# Patient Record
Sex: Female | Born: 1985 | Race: White | Hispanic: No | Marital: Single | Smoking: Former smoker
Health system: Southern US, Community
[De-identification: ages and names within clinical notes are randomized; demographics above are authoritative.]

## PROBLEM LIST (undated history)

## (undated) ENCOUNTER — Inpatient Hospital Stay (HOSPITAL_COMMUNITY): Payer: Self-pay

## (undated) DIAGNOSIS — Z9889 Other specified postprocedural states: Secondary | ICD-10-CM

## (undated) DIAGNOSIS — Z789 Other specified health status: Secondary | ICD-10-CM

## (undated) DIAGNOSIS — R112 Nausea with vomiting, unspecified: Secondary | ICD-10-CM

## (undated) DIAGNOSIS — F419 Anxiety disorder, unspecified: Secondary | ICD-10-CM

---
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
## 2014-08-29 ENCOUNTER — Emergency Department (HOSPITAL_COMMUNITY)
Admission: EM | Admit: 2014-08-29 | Discharge: 2014-08-29 | Disposition: A | Discharge status: Home / Self Care | Payer: Self-pay

## 2014-08-29 ENCOUNTER — Inpatient Hospital Stay (HOSPITAL_COMMUNITY): Payer: Medicaid Other

## 2014-08-29 ENCOUNTER — Inpatient Hospital Stay (HOSPITAL_COMMUNITY)
Admission: AD | Admit: 2014-08-29 | Discharge: 2014-08-29 | Disposition: A | Discharge status: Home / Self Care | Payer: Medicaid Other | Source: Ambulatory Visit | Attending: Obstetrics and Gynecology | Admitting: Obstetrics and Gynecology

## 2014-08-29 ENCOUNTER — Encounter (HOSPITAL_COMMUNITY): Payer: Self-pay | Admitting: *Deleted

## 2014-08-29 DIAGNOSIS — R52 Pain, unspecified: Secondary | ICD-10-CM

## 2014-08-29 DIAGNOSIS — Z3490 Encounter for supervision of normal pregnancy, unspecified, unspecified trimester: Secondary | ICD-10-CM

## 2014-08-29 DIAGNOSIS — O99331 Smoking (tobacco) complicating pregnancy, first trimester: Secondary | ICD-10-CM | POA: Diagnosis not present

## 2014-08-29 DIAGNOSIS — Z3A01 Less than 8 weeks gestation of pregnancy: Secondary | ICD-10-CM | POA: Diagnosis not present

## 2014-08-29 DIAGNOSIS — O26899 Other specified pregnancy related conditions, unspecified trimester: Secondary | ICD-10-CM

## 2014-08-29 DIAGNOSIS — O210 Mild hyperemesis gravidarum: Secondary | ICD-10-CM | POA: Diagnosis not present

## 2014-08-29 DIAGNOSIS — O9989 Other specified diseases and conditions complicating pregnancy, childbirth and the puerperium: Secondary | ICD-10-CM | POA: Diagnosis not present

## 2014-08-29 DIAGNOSIS — R109 Unspecified abdominal pain: Secondary | ICD-10-CM | POA: Insufficient documentation

## 2014-08-29 HISTORY — DX: Other specified health status: Z78.9

## 2014-08-29 LAB — URINALYSIS, ROUTINE W REFLEX MICROSCOPIC
Bilirubin Urine: NEGATIVE
GLUCOSE, UA: NEGATIVE mg/dL
Hgb urine dipstick: NEGATIVE
Ketones, ur: NEGATIVE mg/dL
Leukocytes, UA: NEGATIVE
Nitrite: NEGATIVE
Protein, ur: NEGATIVE mg/dL
Specific Gravity, Urine: 1.015 (ref 1.005–1.030)
Urobilinogen, UA: 0.2 mg/dL (ref 0.0–1.0)
pH: 7.5 (ref 5.0–8.0)

## 2014-08-29 LAB — CBC
HEMATOCRIT: 38.8 % (ref 36.0–46.0)
Hemoglobin: 13.3 g/dL (ref 12.0–15.0)
MCH: 30.9 pg (ref 26.0–34.0)
MCHC: 34.3 g/dL (ref 30.0–36.0)
MCV: 90 fL (ref 78.0–100.0)
Platelets: 231 10*3/uL (ref 150–400)
RBC: 4.31 MIL/uL (ref 3.87–5.11)
RDW: 12.4 % (ref 11.5–15.5)
WBC: 9.1 10*3/uL (ref 4.0–10.5)

## 2014-08-29 LAB — WET PREP, GENITAL
Trich, Wet Prep: NONE SEEN
Yeast Wet Prep HPF POC: NONE SEEN

## 2014-08-29 LAB — POCT PREGNANCY, URINE: PREG TEST UR: POSITIVE — AB

## 2014-08-29 LAB — HCG, QUANTITATIVE, PREGNANCY: HCG, BETA CHAIN, QUANT, S: 67128 m[IU]/mL — AB (ref <5)

## 2014-08-29 LAB — ABO/RH: ABO/RH(D): A POS

## 2014-08-29 MED ORDER — ACETAMINOPHEN 325 MG PO TABS
650.0000 mg | ORAL_TABLET | Freq: Once | ORAL | Status: AC
  Administered 2014-08-29: 650 mg via ORAL
  Filled 2014-08-29: qty 2

## 2014-08-29 MED ORDER — METOCLOPRAMIDE HCL 10 MG PO TABS: 10.0000 mg | ORAL_TABLET | Freq: Four times a day (QID) | ORAL | Status: DC

## 2014-08-29 NOTE — MAU Note (Signed)
Cramping in lower abd for 2-3wks.  Nausea for couple wks.  Period is late.

## 2014-08-29 NOTE — MAU Provider Note (Signed)
History     CSN: [id number]  Arrival date and time: 08/29/14 0944   First Provider Initiated Contact with Patient 08/29/14 1039      Chief Complaint  Patient presents with  . Nausea  . Abdominal Cramping  . Possible Pregnancy   HPI Comments: Joan Tran 29 y.o. [id number] [redacted]w[redacted]d presents to MAU with pelvic pains that have been ongoing for 2 weeks. The pain is 3/10 and she had taken Motrin only. She also has nausea  Abdominal Cramping Associated symptoms include nausea.  Possible Pregnancy Associated symptoms include abdominal pain and nausea.      Past Medical History  Diagnosis Date  . Medical history non-contributory     Past Surgical History  Procedure Laterality Date  . Cesarean section      History reviewed. No pertinent family history.  History  Substance Use Topics  . Smoking status: Current Some Day Smoker  . Smokeless tobacco: Not on file  . Alcohol Use: No    Allergies: No Known Allergies  No prescriptions prior to admission    Review of Systems  Constitutional: Negative.   HENT: Negative.   Respiratory: Negative.   Cardiovascular: Negative.   Gastrointestinal: Positive for nausea and abdominal pain.  Musculoskeletal: Negative.   Skin: Negative.   Neurological: Negative.   Psychiatric/Behavioral: Negative.    Physical Exam   Blood pressure 109/57, pulse 65, temperature 98.2 F (36.8 C), temperature source Oral, resp. rate 16, height 5' (1.524 m), weight 71.215 kg (157 lb), last menstrual period 07/16/2014.  Physical Exam  Constitutional: She is oriented to person, place, and time. She appears well-developed and well-nourished. No distress.  HENT:  Head: Normocephalic and atraumatic.  GI: Soft. She exhibits no distension. There is no tenderness. There is no rebound and no guarding.  Genitourinary:  Genital:external negative Vaginal:small amount white discharge Cervix:closed/ thick Bimanual: gravid   Musculoskeletal: Normal range  of motion.  Neurological: She is alert and oriented to person, place, and time.  Skin: Skin is warm.  Psychiatric: She has a normal mood and affect. Her behavior is normal. Judgment and thought content normal.   Results for orders placed or performed during the hospital encounter of 08/29/14 (from the past 24 hour(s))  Urinalysis, Routine w reflex microscopic     Status: Abnormal   Collection Time: 08/29/14  9:55 AM  Result Value Ref Range   Color, Urine YELLOW YELLOW   APPearance CLOUDY (A) CLEAR   Specific Gravity, Urine 1.015 1.005 - 1.030   pH 7.5 5.0 - 8.0   Glucose, UA NEGATIVE NEGATIVE mg/dL   Hgb urine dipstick NEGATIVE NEGATIVE   Bilirubin Urine NEGATIVE NEGATIVE   Ketones, ur NEGATIVE NEGATIVE mg/dL   Protein, ur NEGATIVE NEGATIVE mg/dL   Urobilinogen, UA 0.2 0.0 - 1.0 mg/dL   Nitrite NEGATIVE NEGATIVE   Leukocytes, UA NEGATIVE NEGATIVE  Pregnancy, urine POC     Status: Abnormal   Collection Time: 08/29/14  9:56 AM  Result Value Ref Range   Preg Test, Ur POSITIVE (A) NEGATIVE  Wet prep, genital     Status: Abnormal   Collection Time: 08/29/14 10:57 AM  Result Value Ref Range   Yeast Wet Prep HPF POC NONE SEEN NONE SEEN   Trich, Wet Prep NONE SEEN NONE SEEN   Clue Cells Wet Prep HPF POC FEW (A) NONE SEEN   WBC, Wet Prep HPF POC FEW (A) NONE SEEN  CBC     Status: None   Collection Time: 08/29/14 11:35  AM  Result Value Ref Range   WBC 9.1 4.0 - 10.5 K/uL   RBC 4.31 3.87 - 5.11 MIL/uL   Hemoglobin 13.3 12.0 - 15.0 g/dL   HCT 11.938.8 14.736.0 - 82.946.0 %   MCV 90.0 78.0 - 100.0 fL   MCH 30.9 26.0 - 34.0 pg   MCHC 34.3 30.0 - 36.0 g/dL   RDW 56.212.4 13.011.5 - 86.515.5 %   Platelets 231 150 - 400 K/uL  hCG, quantitative, pregnancy     Status: Abnormal   Collection Time: 08/29/14 11:35 AM  Result Value Ref Range   hCG, Beta Chain, Quant, S [id number] (H) <5 mIU/mL  ABO/Rh     Status: None (Preliminary result)   Collection Time: 08/29/14 11:35 AM  Result Value Ref Range   ABO/RH(D) A POS     [place] Ob Comp Less 14 Wks  08/29/2014   CLINICAL DATA:  Pelvic pain and cramping. Positive pregnancy test. Gestational age by LMP of 6 weeks 2 days.  EXAM: OBSTETRIC <14 WK US AND TRANSVAGINAL OB US  TECHNIQUE: Both transabdominal and transvaginal ultrasound examinations were performed for complete evaluation of the gestation as well as the maternal uterus, adnexal regions, and pelvic cul-de-sac. Transvaginal technique was performed to assess early pregnancy.  COMPARISON:  None.  FINDINGS: Intrauterine gestational sac: Visualized/normal in shape.  Yolk sac:  Visualized  Embryo:  Visualized  Cardiac Activity: Visualized  Heart Rate:  122 bpm  CRL:   4  mm   6 w 1 d                  US EDC: 04/23/2015  Maternal uterus/adnexae: Small subchorionic hemorrhage noted. Both ovaries are normal in appearance. No mass or free fluid visualized.  IMPRESSION: Single living IUP measuring 6 weeks 1 day with US EDC of 04/23/2015.  Small subchorionic hemorrhage noted. No other significant abnormality identified.   Electronically Signed   By: Myles RosenthalJohn  Stahl M.D.   On: 08/29/2014 12:45   [place] Ob Transvaginal  08/29/2014   CLINICAL DATA:  Pelvic pain and cramping. Positive pregnancy test. Gestational age by LMP of 6 weeks 2 days.  EXAM: OBSTETRIC <14 WK US AND TRANSVAGINAL OB US  TECHNIQUE: Both transabdominal and transvaginal ultrasound examinations were performed for complete evaluation of the gestation as well as the maternal uterus, adnexal regions, and pelvic cul-de-sac. Transvaginal technique was performed to assess early pregnancy.  COMPARISON:  None.  FINDINGS: Intrauterine gestational sac: Visualized/normal in shape.  Yolk sac:  Visualized  Embryo:  Visualized  Cardiac Activity: Visualized  Heart Rate:  122 bpm  CRL:   4  mm   6 w 1 d                  US EDC: 04/23/2015  Maternal uterus/adnexae: Small subchorionic hemorrhage noted. Both ovaries are normal in appearance. No mass or free fluid visualized.  IMPRESSION: Single  living IUP measuring 6 weeks 1 day with US EDC of 04/23/2015.  Small subchorionic hemorrhage noted. No other significant abnormality identified.   Electronically Signed   By: Myles RosenthalJohn  Stahl M.D.   On: 08/29/2014 12:45    MAU Course  Procedures  MDM  Wet prep, GC, Chlamydia, CBC, UA, U/S, ABORh, Quant, HIV Tylenol   Assessment and Plan   A: Pain in early pregnancy Nausea in pregnancy  P: Above orders Advised to stop Ibuprofen products/ tylenol only Start PNV and prenatal care Will message [facility] Return to MAU as  needed  Carolynn Serve 08/29/2014, 11:05 AM

## 2014-08-29 NOTE — Discharge Instructions (Signed)

## 2014-08-30 ENCOUNTER — Encounter: Payer: Self-pay | Admitting: Obstetrics and Gynecology

## 2014-08-30 LAB — HIV ANTIBODY (ROUTINE TESTING W REFLEX): HIV-1/HIV-2 Ab: NONREACTIVE

## 2014-08-31 LAB — GC/CHLAMYDIA PROBE AMP (~~LOC~~) NOT AT ARMC
Chlamydia: NEGATIVE
NEISSERIA GONORRHEA: NEGATIVE

## 2014-09-12 ENCOUNTER — Encounter: Payer: Self-pay | Admitting: Obstetrics and Gynecology

## 2014-10-19 ENCOUNTER — Encounter: Payer: Medicaid Other | Admitting: Physician Assistant

## 2014-10-19 ENCOUNTER — Encounter: Payer: Self-pay | Admitting: Physician Assistant

## 2014-10-23 ENCOUNTER — Encounter: Payer: Self-pay | Admitting: General Practice

## 2014-11-23 LAB — OB RESULTS CONSOLE HIV ANTIBODY (ROUTINE TESTING): HIV: NONREACTIVE

## 2014-11-23 LAB — OB RESULTS CONSOLE HEPATITIS B SURFACE ANTIGEN: Hepatitis B Surface Ag: NEGATIVE

## 2014-11-23 LAB — OB RESULTS CONSOLE GC/CHLAMYDIA
Chlamydia: NEGATIVE
Gonorrhea: NEGATIVE

## 2014-11-23 LAB — OB RESULTS CONSOLE ABO/RH: RH Type: POSITIVE

## 2014-11-23 LAB — OB RESULTS CONSOLE RUBELLA ANTIBODY, IGM: Rubella: IMMUNE

## 2014-11-23 LAB — OB RESULTS CONSOLE ANTIBODY SCREEN: ANTIBODY SCREEN: NEGATIVE

## 2015-04-11 ENCOUNTER — Encounter (HOSPITAL_COMMUNITY): Payer: Self-pay

## 2015-04-12 ENCOUNTER — Encounter (HOSPITAL_COMMUNITY)
Admission: RE | Admit: 2015-04-12 | Discharge: 2015-04-12 | Disposition: A | Discharge status: Home / Self Care | Payer: Medicaid Other | Source: Ambulatory Visit | Attending: Obstetrics and Gynecology | Admitting: Obstetrics and Gynecology

## 2015-04-12 ENCOUNTER — Encounter (HOSPITAL_COMMUNITY): Payer: Self-pay

## 2015-04-12 DIAGNOSIS — Z01818 Encounter for other preprocedural examination: Secondary | ICD-10-CM | POA: Insufficient documentation

## 2015-04-12 HISTORY — DX: Nausea with vomiting, unspecified: R11.2

## 2015-04-12 HISTORY — DX: Other specified postprocedural states: Z98.890

## 2015-04-12 LAB — CBC
HCT: 33.8 % — ABNORMAL LOW (ref 36.0–46.0)
HEMOGLOBIN: 11.3 g/dL — AB (ref 12.0–15.0)
MCH: 28.9 pg (ref 26.0–34.0)
MCHC: 33.4 g/dL (ref 30.0–36.0)
MCV: 86.4 fL (ref 78.0–100.0)
Platelets: 232 10*3/uL (ref 150–400)
RBC: 3.91 MIL/uL (ref 3.87–5.11)
RDW: 13.5 % (ref 11.5–15.5)
WBC: 9.2 10*3/uL (ref 4.0–10.5)

## 2015-04-12 LAB — TYPE AND SCREEN
ABO/RH(D): A POS
Antibody Screen: NEGATIVE

## 2015-04-12 NOTE — Patient Instructions (Signed)
Your procedure is scheduled on:  April 15, 2015  Enter through the Main Entrance of [facility] at:  8:00 am   Pick up the phone at the desk and dial [phone].  Call this number if you have problems the morning of surgery: [phone].  Remember: Do NOT eat food: after midnight on Sunday  Do NOT drink clear liquids after:  midnight on Sunday   Take these medicines the morning of surgery with a SIP OF WATER:  None   Do NOT wear jewelry (body piercing), metal hair clips/bobby pins, or nail polish. Do NOT wear lotions, powders, or perfumes.  You may wear deoderant. Do NOT shave for 48 hours prior to surgery. Do NOT bring valuables to the hospital. Leave suitcase in car.  After surgery it may be brought to your room.  For patients admitted to the hospital, checkout time is 11:00 AM the day of discharge.

## 2015-04-13 LAB — RPR: RPR: NONREACTIVE

## 2015-04-14 NOTE — H&P (Signed)
Joan Tran is a 29 y.o. female, G2 P1001, EGA [redacted] weeks with EDC 9-5 presenting for repeat c-section.  Previous LTCS for breech, initially considered VBAC but has decided she wants repeat c-section.  Prenatal care uncomplicated.  Maternal Medical History:  Fetal activity: Perceived fetal activity is normal.    Prenatal complications: no prenatal complications   OB History    Gravida Para Term Preterm AB TAB SAB Ectopic Multiple Living   Past Medical History  Diagnosis Date  . Medical history non-contributory   . PONV (postoperative nausea and vomiting)    Past Surgical History  Procedure Laterality Date  . Cesarean section     Family History: family history is not on file. Social History:  reports that she quit smoking about 6 months ago. She has never used smokeless tobacco. She reports that she uses illicit drugs (Heroin). She reports that she does not drink alcohol.   Prenatal Transfer Tool  Maternal Diabetes: No Genetic Screening: Normal Maternal Ultrasounds/Referrals: Normal Fetal Ultrasounds or other Referrals:  None Maternal Substance Abuse:  No Significant Maternal Medications:  None Significant Maternal Lab Results:  Lab values include: Group B Strep negative Other Comments:  None  Review of Systems  Respiratory: Negative.   Cardiovascular: Negative.       Last menstrual period 07/16/2014. Maternal Exam:  Abdomen: Patient reports no abdominal tenderness. Surgical scars: low transverse.   Estimated fetal weight is 7 1/2 lbs.   Fetal presentation: vertex  Introitus: Normal vulva. Normal vagina.    Physical Exam  Constitutional: She appears well-developed and well-nourished.  Neck: Neck supple. No thyromegaly present.  Cardiovascular: Normal rate, regular rhythm and normal heart sounds.   No murmur heard. Respiratory: Effort normal and breath sounds normal. No respiratory distress. She has no wheezes.  GI: Soft.    Prenatal  labs: ABO, Rh: --/--/A POS (08/26 1150) Antibody: NEG (08/26 1150) Rubella: Immune (04/08 0000) RPR: Non Reactive (08/26 1150)  HBsAg: Negative (04/08 0000)  HIV: Non-reactive (04/08 0000)  GBS:   neg  Assessment/Plan: IUP at 39 weeks for repeat c-section.  Procedure and risks have been discussed.   Alicianna Litchford D 04/14/2015, 7:00 PM

## 2015-04-15 ENCOUNTER — Inpatient Hospital Stay (HOSPITAL_COMMUNITY): Payer: Medicaid Other | Admitting: Anesthesiology

## 2015-04-15 ENCOUNTER — Encounter (HOSPITAL_COMMUNITY)
Admission: RE | Disposition: A | Discharge status: Home / Self Care | Payer: Self-pay | Source: Ambulatory Visit | Attending: Obstetrics and Gynecology

## 2015-04-15 ENCOUNTER — Encounter (HOSPITAL_COMMUNITY): Payer: Self-pay

## 2015-04-15 ENCOUNTER — Inpatient Hospital Stay (HOSPITAL_COMMUNITY)
Admission: RE | Admit: 2015-04-15 | Discharge: 2015-04-17 | DRG: 766 | Disposition: A | Discharge status: Home / Self Care | Payer: Medicaid Other | Source: Ambulatory Visit | Attending: Obstetrics and Gynecology | Admitting: Obstetrics and Gynecology

## 2015-04-15 DIAGNOSIS — O3421 Maternal care for scar from previous cesarean delivery: Secondary | ICD-10-CM | POA: Diagnosis present

## 2015-04-15 DIAGNOSIS — O99214 Obesity complicating childbirth: Secondary | ICD-10-CM | POA: Diagnosis present

## 2015-04-15 DIAGNOSIS — Z87891 Personal history of nicotine dependence: Secondary | ICD-10-CM | POA: Diagnosis not present

## 2015-04-15 DIAGNOSIS — Z3A39 39 weeks gestation of pregnancy: Secondary | ICD-10-CM | POA: Diagnosis present

## 2015-04-15 DIAGNOSIS — Z98891 History of uterine scar from previous surgery: Secondary | ICD-10-CM

## 2015-04-15 SURGERY — Surgical Case
Anesthesia: Spinal | Site: Abdomen

## 2015-04-15 MED ORDER — DIBUCAINE 1 % RE OINT: 1.0000 "application " | TOPICAL_OINTMENT | RECTAL | Status: DC | PRN

## 2015-04-15 MED ORDER — 0.9 % SODIUM CHLORIDE (POUR BTL) OPTIME
TOPICAL | Status: DC | PRN
  Administered 2015-04-15: 1000 mL

## 2015-04-15 MED ORDER — ZOLPIDEM TARTRATE 5 MG PO TABS: 5.0000 mg | ORAL_TABLET | Freq: Every evening | ORAL | Status: DC | PRN

## 2015-04-15 MED ORDER — SCOPOLAMINE 1 MG/3DAYS TD PT72: 1.0000 | MEDICATED_PATCH | Freq: Once | TRANSDERMAL | Status: DC

## 2015-04-15 MED ORDER — HYDROMORPHONE HCL 1 MG/ML IJ SOLN: 0.2500 mg | INTRAMUSCULAR | Status: DC | PRN

## 2015-04-15 MED ORDER — ONDANSETRON HCL 4 MG/2ML IJ SOLN
INTRAMUSCULAR | Status: DC | PRN
  Administered 2015-04-15: 4 mg via INTRAVENOUS

## 2015-04-15 MED ORDER — WITCH HAZEL-GLYCERIN EX PADS: 1.0000 "application " | MEDICATED_PAD | CUTANEOUS | Status: DC | PRN

## 2015-04-15 MED ORDER — PHENYLEPHRINE 8 MG IN D5W 100 ML (0.08MG/ML) PREMIX OPTIME
INJECTION | INTRAVENOUS | Status: DC | PRN
  Administered 2015-04-15: 60 ug/min via INTRAVENOUS

## 2015-04-15 MED ORDER — ERYTHROMYCIN 5 MG/GM OP OINT
TOPICAL_OINTMENT | OPHTHALMIC | Status: AC
  Filled 2015-04-15: qty 1

## 2015-04-15 MED ORDER — MORPHINE SULFATE (PF) 0.5 MG/ML IJ SOLN
INTRAMUSCULAR | Status: DC | PRN
  Administered 2015-04-15: .1 mg via INTRATHECAL

## 2015-04-15 MED ORDER — GLYCOPYRROLATE 0.2 MG/ML IJ SOLN
INTRAMUSCULAR | Status: AC
  Filled 2015-04-15: qty 1

## 2015-04-15 MED ORDER — LACTATED RINGERS IV SOLN
40.0000 [IU] | INTRAVENOUS | Status: DC | PRN
  Administered 2015-04-15: 40 [IU] via INTRAVENOUS

## 2015-04-15 MED ORDER — LACTATED RINGERS IV SOLN: INTRAVENOUS | Status: DC

## 2015-04-15 MED ORDER — MORPHINE SULFATE 0.5 MG/ML IJ SOLN
INTRAMUSCULAR | Status: AC
  Filled 2015-04-15: qty 100

## 2015-04-15 MED ORDER — FENTANYL CITRATE (PF) 100 MCG/2ML IJ SOLN
INTRAMUSCULAR | Status: AC
  Filled 2015-04-15: qty 4

## 2015-04-15 MED ORDER — CEFAZOLIN SODIUM-DEXTROSE 2-3 GM-% IV SOLR
2.0000 g | INTRAVENOUS | Status: AC
  Administered 2015-04-15: 2 g via INTRAVENOUS

## 2015-04-15 MED ORDER — OXYTOCIN 40 UNITS IN LACTATED RINGERS INFUSION - SIMPLE MED: 62.5000 mL/h | INTRAVENOUS | Status: AC

## 2015-04-15 MED ORDER — SODIUM CHLORIDE 0.9 % IJ SOLN: 3.0000 mL | INTRAMUSCULAR | Status: DC | PRN

## 2015-04-15 MED ORDER — NALBUPHINE HCL 10 MG/ML IJ SOLN
5.0000 mg | INTRAMUSCULAR | Status: DC | PRN
  Administered 2015-04-15: 5 mg via SUBCUTANEOUS
  Filled 2015-04-15: qty 1

## 2015-04-15 MED ORDER — NALOXONE HCL 1 MG/ML IJ SOLN
1.0000 ug/kg/h | INTRAMUSCULAR | Status: DC | PRN
  Filled 2015-04-15: qty 2

## 2015-04-15 MED ORDER — CEFAZOLIN SODIUM-DEXTROSE 2-3 GM-% IV SOLR
INTRAVENOUS | Status: AC
  Filled 2015-04-15: qty 50

## 2015-04-15 MED ORDER — ACETAMINOPHEN 500 MG PO TABS
1000.0000 mg | ORAL_TABLET | Freq: Four times a day (QID) | ORAL | Status: AC
  Administered 2015-04-15: 1000 mg via ORAL
  Filled 2015-04-15: qty 2

## 2015-04-15 MED ORDER — MEPERIDINE HCL 25 MG/ML IJ SOLN: 6.2500 mg | INTRAMUSCULAR | Status: DC | PRN

## 2015-04-15 MED ORDER — IBUPROFEN 600 MG PO TABS
600.0000 mg | ORAL_TABLET | Freq: Four times a day (QID) | ORAL | Status: DC
  Administered 2015-04-15 – 2015-04-17 (×8): 600 mg via ORAL
  Filled 2015-04-15 (×8): qty 1

## 2015-04-15 MED ORDER — LACTATED RINGERS IV SOLN
INTRAVENOUS | Status: DC
  Administered 2015-04-15 (×2): via INTRAVENOUS

## 2015-04-15 MED ORDER — ACETAMINOPHEN 160 MG/5ML PO SOLN
ORAL | Status: AC
  Administered 2015-04-15: 975 mg via ORAL
  Filled 2015-04-15: qty 40.6

## 2015-04-15 MED ORDER — SCOPOLAMINE 1 MG/3DAYS TD PT72
1.0000 | MEDICATED_PATCH | Freq: Once | TRANSDERMAL | Status: DC
  Administered 2015-04-15: 1.5 mg via TRANSDERMAL

## 2015-04-15 MED ORDER — SIMETHICONE 80 MG PO CHEW
80.0000 mg | CHEWABLE_TABLET | ORAL | Status: DC | PRN
  Administered 2015-04-16 – 2015-04-17 (×3): 80 mg via ORAL
  Filled 2015-04-15 (×4): qty 1

## 2015-04-15 MED ORDER — DIPHENHYDRAMINE HCL 25 MG PO CAPS: 25.0000 mg | ORAL_CAPSULE | Freq: Four times a day (QID) | ORAL | Status: DC | PRN

## 2015-04-15 MED ORDER — OXYCODONE-ACETAMINOPHEN 5-325 MG PO TABS
1.0000 | ORAL_TABLET | ORAL | Status: DC | PRN
  Administered 2015-04-15: 1 via ORAL
  Administered 2015-04-16 (×4): 2 via ORAL
  Filled 2015-04-15 (×2): qty 2
  Filled 2015-04-15: qty 1
  Filled 2015-04-15 (×2): qty 2

## 2015-04-15 MED ORDER — DIPHENHYDRAMINE HCL 50 MG/ML IJ SOLN: 12.5000 mg | INTRAMUSCULAR | Status: DC | PRN

## 2015-04-15 MED ORDER — MEASLES, MUMPS & RUBELLA VAC ~~LOC~~ INJ: 0.5000 mL | INJECTION | Freq: Once | SUBCUTANEOUS | Status: DC

## 2015-04-15 MED ORDER — LANOLIN HYDROUS EX OINT: 1.0000 "application " | TOPICAL_OINTMENT | CUTANEOUS | Status: DC | PRN

## 2015-04-15 MED ORDER — NALBUPHINE HCL 10 MG/ML IJ SOLN: 5.0000 mg | Freq: Once | INTRAMUSCULAR | Status: AC | PRN

## 2015-04-15 MED ORDER — TETANUS-DIPHTH-ACELL PERTUSSIS 5-2.5-18.5 LF-MCG/0.5 IM SUSP: 0.5000 mL | Freq: Once | INTRAMUSCULAR | Status: DC

## 2015-04-15 MED ORDER — ACETAMINOPHEN 325 MG PO TABS: 650.0000 mg | ORAL_TABLET | ORAL | Status: DC | PRN

## 2015-04-15 MED ORDER — NALBUPHINE HCL 10 MG/ML IJ SOLN: 5.0000 mg | INTRAMUSCULAR | Status: DC | PRN

## 2015-04-15 MED ORDER — NALBUPHINE HCL 10 MG/ML IJ SOLN
INTRAMUSCULAR | Status: AC
  Filled 2015-04-15: qty 1

## 2015-04-15 MED ORDER — KETOROLAC TROMETHAMINE 60 MG/2ML IM SOLN
60.0000 mg | Freq: Once | INTRAMUSCULAR | Status: AC
  Administered 2015-04-15: 60 mg via INTRAMUSCULAR

## 2015-04-15 MED ORDER — ONDANSETRON HCL 4 MG/2ML IJ SOLN: 4.0000 mg | Freq: Three times a day (TID) | INTRAMUSCULAR | Status: DC | PRN

## 2015-04-15 MED ORDER — PRENATAL MULTIVITAMIN CH
1.0000 | ORAL_TABLET | Freq: Every day | ORAL | Status: DC
  Administered 2015-04-16 – 2015-04-17 (×2): 1 via ORAL
  Filled 2015-04-15 (×2): qty 1

## 2015-04-15 MED ORDER — MAGNESIUM HYDROXIDE 400 MG/5ML PO SUSP: 30.0000 mL | ORAL | Status: DC | PRN

## 2015-04-15 MED ORDER — MENTHOL 3 MG MT LOZG: 1.0000 | LOZENGE | OROMUCOSAL | Status: DC | PRN

## 2015-04-15 MED ORDER — LACTATED RINGERS IV SOLN
INTRAVENOUS | Status: DC | PRN
  Administered 2015-04-15: 10:00:00 via INTRAVENOUS

## 2015-04-15 MED ORDER — ACETAMINOPHEN 160 MG/5ML PO SOLN
975.0000 mg | Freq: Once | ORAL | Status: AC
  Administered 2015-04-15: 975 mg via ORAL

## 2015-04-15 MED ORDER — SCOPOLAMINE 1 MG/3DAYS TD PT72
MEDICATED_PATCH | TRANSDERMAL | Status: AC
  Administered 2015-04-15: 1.5 mg via TRANSDERMAL
  Filled 2015-04-15: qty 1

## 2015-04-15 MED ORDER — ONDANSETRON HCL 4 MG/2ML IJ SOLN
INTRAMUSCULAR | Status: AC
  Filled 2015-04-15: qty 2

## 2015-04-15 MED ORDER — NALBUPHINE HCL 10 MG/ML IJ SOLN
5.0000 mg | Freq: Once | INTRAMUSCULAR | Status: AC | PRN
  Administered 2015-04-15: 5 mg via INTRAVENOUS

## 2015-04-15 MED ORDER — KETOROLAC TROMETHAMINE 60 MG/2ML IM SOLN
INTRAMUSCULAR | Status: AC
  Administered 2015-04-15: 60 mg via INTRAMUSCULAR
  Filled 2015-04-15: qty 2

## 2015-04-15 MED ORDER — OXYTOCIN 10 UNIT/ML IJ SOLN
INTRAMUSCULAR | Status: AC
  Filled 2015-04-15: qty 4

## 2015-04-15 MED ORDER — FENTANYL CITRATE (PF) 100 MCG/2ML IJ SOLN
INTRAMUSCULAR | Status: DC | PRN
  Administered 2015-04-15: 25 ug via INTRATHECAL

## 2015-04-15 MED ORDER — SENNOSIDES-DOCUSATE SODIUM 8.6-50 MG PO TABS
2.0000 | ORAL_TABLET | ORAL | Status: DC
  Administered 2015-04-16 (×2): 2 via ORAL
  Filled 2015-04-15 (×2): qty 2

## 2015-04-15 MED ORDER — PHENYLEPHRINE 8 MG IN D5W 100 ML (0.08MG/ML) PREMIX OPTIME
INJECTION | INTRAVENOUS | Status: AC
  Filled 2015-04-15: qty 100

## 2015-04-15 MED ORDER — DIPHENHYDRAMINE HCL 25 MG PO CAPS: 25.0000 mg | ORAL_CAPSULE | ORAL | Status: DC | PRN

## 2015-04-15 MED ORDER — NALOXONE HCL 0.4 MG/ML IJ SOLN: 0.4000 mg | INTRAMUSCULAR | Status: DC | PRN

## 2015-04-15 SURGICAL SUPPLY — 28 items
BENZOIN TINCTURE PRP APPL 2/3 (GAUZE/BANDAGES/DRESSINGS) ×3 IMPLANT
CLAMP CORD UMBIL (MISCELLANEOUS) ×3 IMPLANT
CLOSURE WOUND 1/2 X4 (GAUZE/BANDAGES/DRESSINGS) ×1
CLOTH BEACON ORANGE TIMEOUT ST (SAFETY) ×3 IMPLANT
DRAPE SHEET LG 3/4 BI-LAMINATE (DRAPES) ×3 IMPLANT
DRSG OPSITE POSTOP 4X10 (GAUZE/BANDAGES/DRESSINGS) ×3 IMPLANT
DURAPREP 26ML APPLICATOR (WOUND CARE) ×3 IMPLANT
ELECT REM PT RETURN 9FT ADLT (ELECTROSURGICAL) ×3
ELECTRODE REM PT RTRN 9FT ADLT (ELECTROSURGICAL) ×1 IMPLANT
GLOVE ORTHO TXT STRL SZ7.5 (GLOVE) ×3 IMPLANT
GOWN STRL REUS W/TWL LRG LVL3 (GOWN DISPOSABLE) ×6 IMPLANT
NEEDLE HYPO 25X5/8 SAFETYGLIDE (NEEDLE) ×3 IMPLANT
NS IRRIG 1000ML POUR BTL (IV SOLUTION) ×3 IMPLANT
PACK C SECTION WH (CUSTOM PROCEDURE TRAY) ×3 IMPLANT
PAD OB MATERNITY 4.3X12.25 (PERSONAL CARE ITEMS) ×3 IMPLANT
PENCIL SMOKE EVAC W/HOLSTER (ELECTROSURGICAL) ×3 IMPLANT
RTRCTR C-SECT PINK 25CM LRG (MISCELLANEOUS) ×3 IMPLANT
STRIP CLOSURE SKIN 1/2X4 (GAUZE/BANDAGES/DRESSINGS) ×2 IMPLANT
SUT CHROMIC 1 CTX 36 (SUTURE) ×6 IMPLANT
SUT PLAIN 2 0 XLH (SUTURE) ×3 IMPLANT
SUT VIC AB 0 CT1 27 (SUTURE) ×4
SUT VIC AB 0 CT1 27XBRD ANBCTR (SUTURE) ×2 IMPLANT
SUT VIC AB 2-0 CT1 (SUTURE) ×3 IMPLANT
SUT VIC AB 2-0 CT1 27 (SUTURE) ×2
SUT VIC AB 2-0 CT1 TAPERPNT 27 (SUTURE) ×1 IMPLANT
SUT VIC AB 4-0 KS 27 (SUTURE) ×3 IMPLANT
TOWEL OR 17X24 6PK STRL BLUE (TOWEL DISPOSABLE) ×3 IMPLANT
TRAY FOLEY CATH SILVER 14FR (SET/KITS/TRAYS/PACK) ×3 IMPLANT

## 2015-04-15 NOTE — Addendum Note (Signed)
Addendum  created 04/15/15 1629 by Elgie Congo, CRNA   Modules edited: Notes Section   Notes Section:  File: [id number]

## 2015-04-15 NOTE — Transfer of Care (Signed)
Immediate Anesthesia Transfer of Care Note  Patient: Joan Tran  Procedure(s) Performed: Procedure(s) with comments: CESAREAN SECTION (N/A) - Tracie S. RNFA confirmed 03/22/15   Patient Location: PACU  Anesthesia Type:Spinal  Level of Consciousness: awake, alert  and oriented  Airway & Oxygen Therapy: Patient Spontanous Breathing  Post-op Assessment: Report given to RN and Post -op Vital signs reviewed and stable  Post vital signs: Reviewed and stable  Last Vitals:  Filed Vitals:   04/15/15 0824  BP: 115/88  Pulse: 85  Temp: 36.7 C  Resp: 20    Complications: No apparent anesthesia complications

## 2015-04-15 NOTE — Progress Notes (Signed)
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  Per chart review, MOB participated in 2 year treatment, and denied any use since 2013.  CSW screening out referral at this time. No need to collect infant's UDS/MDS.  Contact CSW if needs arise or upon MOB request.  Loleta Books, LCSW [phone]

## 2015-04-15 NOTE — Anesthesia Procedure Notes (Signed)

## 2015-04-15 NOTE — Op Note (Signed)
Preoperative diagnosis: Intrauterine pregnancy at 39 weeks, previous c-section Postoperative diagnosis: Same Procedure: Repeat low transverse cesarean section without extensions Surgeon: Lavina Hamman M.D. Assistant:  Karmen Stabs, RNFA Anesthesia: Spinal  Findings: Patient had normal gravid anatomy and delivered a viable female infant with Apgars of 9 and 9 weight pending Estimated blood loss: 800 cc Specimens: Placenta sent to labor and delivery Complications: None  Procedure in detail: The patient was taken to the operating room and placed in the sitting position. Dr. Cristela Blue instilled spinal anesthesia.  She was then placed in the dorsosupine position with left tilt. Abdomen was then prepped and draped in the usual sterile fashion, and a foley catheter was inserted. The level of her anesthesia was found to be adequate. Abdomen was entered via a standard Pfannenstiel incision through her previous scar. Once the peritoneal cavity was entered the Alexis disposable self-retaining retractor was placed and good visualization was achieved. A 4 cm transverse incision was then made in the lower uterine segment pushing the bladder inferior. Once the uterine cavity was entered the incision was extended digitally. The fetal vertex was grasped and delivered through the incision atraumatically. Mouth and nares were suctioned. The remainder of the infant then delivered atraumatically. Cord was doubly clamped and cut and the infant handed to the awaiting pediatric team. Cord blood was obtained. The placenta delivered spontaneously. Uterus was wiped dry with clean lap pad and all clots and debris were removed. Uterine incision was inspected and found to be free of extensions. Uterine incision was closed in 1 layer with running #1 Chromic. Bleeding from the right angle was controlled with one figure 8 suture of #1 Chromic.  Tubes and ovaries were inspected and found to be normal. Uterine incision was inspected  and found to be hemostatic. Bleeding from serosal edges was controlled with electrocautery. The Alexis retractor was removed. Subfascial space was irrigated and made hemostatic with electrocautery. Peritoneum was closed with 2-0 Vicryl.  Fascia was closed in running fashion starting at both ends and meeting in the middle with 0 Vicryl. Subcutaneous tissue was then irrigated and made hemostatic with electrocautery, then closed with running 2-0 plain gut. Skin was closed with running 4-0 Vicryl subcuticular suture followed by steri-strips and a sterile dressing. Patient tolerated the procedure well and was taken to the recovery in stable condition. Counts were correct x2, she received Ancef 2 g IV at the beginning of the procedure and she had PAS hose on throughout the procedure.

## 2015-04-15 NOTE — Anesthesia Postprocedure Evaluation (Signed)
  Anesthesia Post-op Note  Patient: Joan Tran  Procedure(s) Performed: Procedure(s) with comments: CESAREAN SECTION (N/A) - Tracie S. RNFA confirmed 03/22/15  Patient is awake, responsive, moving her legs, and has signs of resolution of her numbness. Pain and nausea are reasonably well controlled. Vital signs are stable and clinically acceptable. Oxygen saturation is clinically acceptable. There are no apparent anesthetic complications at this time. Patient is ready for discharge.

## 2015-04-15 NOTE — Anesthesia Preprocedure Evaluation (Signed)
Anesthesia Evaluation  Patient identified by MRN, date of birth, ID band Patient awake    Reviewed: Allergy & Precautions, H&P , Patient's Chart, lab work & pertinent test results  Airway Mallampati: II TM Distance: >3 FB Neck ROM: full    Dental no notable dental hx.    Pulmonary former smoker,  breath sounds clear to auscultation  Pulmonary exam normal       Cardiovascular Exercise Tolerance: Good Rhythm:regular Rate:Normal     Neuro/Psych    GI/Hepatic   Endo/Other  Morbid obesity  Renal/GU      Musculoskeletal   Abdominal   Peds  Hematology   Anesthesia Other Findings   Reproductive/Obstetrics                          Anesthesia Physical Anesthesia Plan  ASA: III  Anesthesia Plan: Spinal   Post-op Pain Management:    Induction:   Airway Management Planned:   Additional Equipment:   Intra-op Plan:   Post-operative Plan:   Informed Consent: I have reviewed the patients History and Physical, chart, labs and discussed the procedure including the risks, benefits and alternatives for the proposed anesthesia with the patient or authorized representative who has indicated his/her understanding and acceptance.   Dental Advisory Given  Plan Discussed with: CRNA  Anesthesia Plan Comments: (Lab work confirmed with CRNA in room. Platelets okay. Discussed spinal anesthetic, and patient consents to the procedure:  included risk of possible headache,backache, failed block, allergic reaction, and nerve injury. This patient was asked if she had any questions or concerns before the procedure started. )        Anesthesia Quick Evaluation  

## 2015-04-15 NOTE — Interval H&P Note (Signed)
History and Physical Interval Note:  04/15/2015 9:08 AM  Joan Tran  has presented today for surgery, with the diagnosis of Repeat C/Section  The various methods of treatment have been discussed with the patient and family. After consideration of risks, benefits and other options for treatment, the patient has consented to  Procedure(s) with comments: CESAREAN SECTION (N/A) - Tracie S. RNFA confirmed 03/22/15  as a surgical intervention .  The patient's history has been reviewed, patient examined, no change in status, stable for surgery.  I have reviewed the patient's chart and labs.  Questions were answered to the patient's satisfaction.     Marcella Charlson D

## 2015-04-15 NOTE — Progress Notes (Signed)
Patient ID: Joan Tran, female   DOB: [birth year], 29 y.o.   MRN: [id number] Pt doing well post op. Sitting up in bed. Pain well controlled. Pt requests d/c iv access. Area around site swollen and infiltrated. Had to have 4-5 attempts at different locations before placement effected  VSS  ABD: fundus firm; dressing c/d/i   Plan : will d/c iv access now and foley at 9pm ( 12hrs post op) if ambulating well

## 2015-04-15 NOTE — Anesthesia Postprocedure Evaluation (Signed)
  Anesthesia Post-op Note  Patient: Joan Tran  Procedure(s) Performed: Procedure(s) with comments: CESAREAN SECTION (N/A) - Tracie S. RNFA confirmed 03/22/15   Patient Location: Mother/Baby  Anesthesia Type:Spinal  Level of Consciousness: awake, alert  and oriented  Airway and Oxygen Therapy: Patient Spontanous Breathing  Post-op Pain: none  Post-op Assessment: Post-op Vital signs reviewed and Patient's Cardiovascular Status Stable              Post-op Vital Signs: Reviewed and stable  Last Vitals:  Filed Vitals:   04/15/15 1400  BP: 109/62  Pulse: 65  Temp: 36.7 C  Resp: 20    Complications: No apparent anesthesia complications

## 2015-04-16 ENCOUNTER — Encounter (HOSPITAL_COMMUNITY): Payer: Self-pay | Admitting: *Deleted

## 2015-04-16 LAB — CBC
HEMATOCRIT: 29 % — AB (ref 36.0–46.0)
Hemoglobin: 9.6 g/dL — ABNORMAL LOW (ref 12.0–15.0)
MCH: 29 pg (ref 26.0–34.0)
MCHC: 33.1 g/dL (ref 30.0–36.0)
MCV: 87.6 fL (ref 78.0–100.0)
Platelets: 196 10*3/uL (ref 150–400)
RBC: 3.31 MIL/uL — ABNORMAL LOW (ref 3.87–5.11)
RDW: 14 % (ref 11.5–15.5)
WBC: 12.5 10*3/uL — AB (ref 4.0–10.5)

## 2015-04-16 MED ORDER — HYDROMORPHONE HCL 2 MG PO TABS
2.0000 mg | ORAL_TABLET | ORAL | Status: DC | PRN
  Administered 2015-04-16 – 2015-04-17 (×4): 2 mg via ORAL
  Filled 2015-04-16 (×5): qty 1

## 2015-04-16 NOTE — Clinical Social Work Maternal (Signed)
CLINICAL SOCIAL WORK MATERNAL/CHILD NOTE  Patient Details  Name: Joan Tran MRN: [id number] Date of Birth: 08/29/1985  Date:  November 16, 2014  Clinical Social Worker Initiating Note:  Joan Books, LCSW Date/ Time Initiated:  May 30, 2015/0900     Child's Name:  Joan Tran Age   Legal Guardian:  Mother   Need for Interpreter:  None   Date of Referral:  July 10, 2015     Reason for Referral:  History of substance use, Potential loss of custody of first child  Referral Source:  [place]   Address:  [address] [place], [place] [address]  Phone number:  [phone]   Household Members:  Spouse   Natural Supports (not living in the home):  Extended Family, Immediate Family   Professional Supports: None   Employment: Full-time   Type of Work:     Education:      Architect:  [employer]   Other Resources:  [employer]   Cultural/Religious Considerations Which May Impact Care:  None reported  Strengths:  Ability to meet basic needs , Home prepared for child    Risk Factors/Current Problems:  None   Cognitive State:  Able to Concentrate , Alert , Goal Oriented , Linear Thinking    Mood/Affect:  Happy , Animated   CSW Assessment:  CSW received request for consult due to MOB presenting with a history of substance use and concerns about custody of her first child.  MOB and FOB presented as easily engaged and receptive to the visit. CSW noted that MOB presented in a pleasant mood and displayed a full range in affect.  MOB's speech was rapid and slightly pressured, but was able to engage in a liner and goal orientated conversation.  Prior to meeting with MOB, CSW consulted with RN who reported that MOB may be anxious or nervous as she transitions to postpartum and cares for the infant.   MOB reported numerous feelings of happiness and excitement secondary to the infant's birth. She stated that she has been eager for his birth, and reported that they have a large support  system of family who has helped them prepare for the infant. MOB and FOB discussed that they recently moved to [place] (previously were living in [place]), and have purchased a home. They discussed how they are both employed (and feel supportive), and are looking forward to watching the MOB's first daughter transition to becoming a big sister. MOB discussed normative feelings of sadness secondary to missing her first daughter's first day of school, but reported that her daughter did not appear impacted by her absence.  MOB reflected upon all the changes that have occurred since her daughter was born 6 years ago. Per MOB, she is in a "better place" now since previously she did not have steady housing and was under the age of 7.  MOB shared that she feels more prepared for this infant, and discussed looking forward to having an infant again. MOB reported that her grandmother and mother assisted her with caring for her daughter while she was younger, but shared that she has always maintained legal custody of her.  MOB discussed that she has remained in contact and interacted regularly with her, and reported that they want to ensure that it is a smooth transition for her back into her home.  CSW acknowledged the numerous life transitions that MOB has experienced in recent moves, and explored with MOB how they may impact her transition postpartum.  MOB acknowledged education on perinatal mood and anxiety, and  shared that she has no prior history of PMAD, but reported that she has had family members who have provided support and education to her about symptoms. MOB did discuss anxiety secondary to finances and also acknowledged the presence of automatic negative thoughts about herself during the pregnancy, but denied belief that they were intense or last lasting. MOB denied any barriers to accessing care in the event that she experiences symptoms.    CSW did not address substance abuse (per request for consult)  due to chart review. MOB's chart documents history of heroin use, but documented that she participated in treatment and has had not been in an active state of addiction since 2013. No need to address history at this time.   MOB denied additional questions, concerns, or needs at this time. She agreed to contact CSW if needs arise during the admission.  CSW Plan/Description:   1)Patient/Family Education: Perinatal mood and anxiety disorders 2)No Further Intervention Required/No Barriers to Discharge    Joan Tran 25-Oct-2014, 9:39 AM

## 2015-04-16 NOTE — Progress Notes (Signed)
Subjective: Postpartum Day #1: Cesarean Delivery Patient reports incisional pain, tolerating PO and no problems voiding.    Objective: Vital signs in last 24 hours: Temp:  [97.6 F (36.4 C)-98.6 F (37 C)] 97.8 F (36.6 C) (08/30 1610) Pulse Rate:  [54-85] 57 (08/30 0633) Resp:  [18-27] 20 (08/30 0633) BP: (96-115)/(43-88) 101/43 mmHg (08/30 0633) SpO2:  [94 %-100 %] 94 % (08/30 9604)  Physical Exam:  General: alert Lochia: appropriate Uterine Fundus: firm Incision: dressing C/D/I   Recent Labs  04/16/15 0540  HGB 9.6*  HCT 29.0*    Assessment/Plan: Status post Cesarean section. Doing well postoperatively.  Continue current care, ambulate.  Beverlyann Broxterman D 04/16/2015, 8:13 AM

## 2015-04-16 NOTE — Progress Notes (Signed)
Upon entering pt's room and introducing myself- pt began c/o unrelieved pain even after pain medication given 1 hour earlier. Spoke with pt regarding the need for ambulation and movement to help with pain and gas pain control. Advised pt on how often and how long to walk in hallways. Pt stated "I will tell you right now that if I don't want to move, I will not move". Then let pt know that I would need to do an assessment on her. Pt then started yelling and stated "I don't know why I have to be checked again, if the doctor already checked me and that other nurse said that I wouldn't have to be checked again!" I made pt aware that each nurse on each shift does an assessment at least once on their pt's. Pt continued to yell, stating "I can refuse to be checked if I want to!". Pt then stated "Well I refuse to have my stomach checked". I said to her that that was ok. I proceeded to assess pt's lung sounds, then asked her if I could take a look at her incision dressing. Pt started yelling again, stating "The doctor already looked at my dressing, so I don't know why you have to look at it again!, I just don't want to be touched anymore!". This nurse then told pt that if she did not want to be checked that was ok, I then asked pt if she wanted me to come in the room at all tonight to check on her. Pt asked "well when would you come in?" I told pt of my rounding times and she said "well that's ok". I then told pt "Itza, let me just let you know that we are her to take care of you and make sure you are doing ok". Pt then started yelling again and stated "Well I don't need a fucking lecture and I don't need anyone making me feel bad when I already feel bad!" I then asked pt if she would like another nurse to take care of her and she stated "yes, because I don't need no fucking lectures from anyone!" I let pt know then that's what we would do and then left the room.

## 2015-04-16 NOTE — Progress Notes (Signed)
UR chart review completed.  

## 2015-04-16 NOTE — Progress Notes (Signed)
Went to speak with patient at request of RN.  Pt stated she did not want to be checked by the nursing staff because she was in pain.  I reviewed with the patient reasons the nurses needed to check her such as watching for hemorrhage and infection or issues with her incision and she said she understood but still refuses to be checked.  I told her that if at any point she would allow the staff to check her she should let her nurse know.

## 2015-04-16 NOTE — Progress Notes (Signed)
Patient ID: Joan Tran, female   DOB: 22-Aug-1985, 29 y.o.   MRN: [id number] Pt reports severe pain. Abdomen soft and not especially tender. Will d/c percocet and begin dilaudid

## 2015-04-17 MED ORDER — IBUPROFEN 600 MG PO TABS: 600.0000 mg | ORAL_TABLET | Freq: Four times a day (QID) | ORAL | Status: DC

## 2015-04-17 MED ORDER — HYDROMORPHONE HCL 2 MG PO TABS
2.0000 mg | ORAL_TABLET | ORAL | Status: DC | PRN
  Administered 2015-04-17 (×2): 2 mg via ORAL
  Filled 2015-04-17: qty 1

## 2015-04-17 MED ORDER — HYDROMORPHONE HCL 2 MG PO TABS: 2.0000 mg | ORAL_TABLET | ORAL | Status: DC | PRN

## 2015-04-17 NOTE — Discharge Summary (Signed)
Obstetric Discharge Summary Reason for Admission: cesarean section Prenatal Procedures: none Intrapartum Procedures: cesarean: low cervical, transverse Postpartum Procedures: none Complications-Operative and Postpartum: none HEMOGLOBIN  Date Value Ref Range Status  04/16/2015 9.6* 12.0 - 15.0 g/dL Final   HCT  Date Value Ref Range Status  04/16/2015 29.0* 36.0 - 46.0 % Final    Physical Exam:  General: alert Lochia: appropriate Uterine Fundus: firm Incision: healing well  Discharge Diagnoses: Term Pregnancy-delivered  Discharge Information: Date: 04/17/2015 Activity: pelvic rest and no strenuous activity Diet: routine Medications: Ibuprofen and Dilaudid Condition: stable Instructions: refer to practice specific booklet Discharge to: home Follow-up Information    Follow up with Millette Halberstam D, MD. Schedule an appointment as soon as possible for a visit in 2 weeks.   Specialty:  Obstetrics and Gynecology   Contact information:   [address], [address] [place] [address] [phone]       Newborn Data: Live born female  Birth Weight: 8 lb 0.4 oz (3640 g) APGAR: 8, 9  Home with mother.  Lorri Fukuhara D 04/17/2015, 1:13 PM

## 2015-04-17 NOTE — Discharge Instructions (Signed)
As per discharge pamphlet °

## 2015-04-17 NOTE — Progress Notes (Signed)
Refusing to allow me to perform my assessment.  Stated Dr. Jackelyn Knife examed her this am already and he said "everything was good."

## 2015-04-17 NOTE — Progress Notes (Signed)
POD #2 Pain is better this am after changing to dilaudid and some relief of gas pain with simethicone and flatus, no other problems Afeb, VSS Abd- soft, fundus firm, incision intact Continue routine care, will make sure she is stable this am, may d/c this pm if stable at her request

## 2016-07-11 IMAGING — US US OB COMP LESS 14 WK
1 series · 14 of 28 positions shown · non-contrast
Comparison: None.

CLINICAL DATA: Pelvic pain and cramping. Positive pregnancy test.
Gestational age by LMP of 6 weeks 2 days.

EXAM:
OBSTETRIC <14 WK US AND TRANSVAGINAL OB US
TECHNIQUE: Both transabdominal and transvaginal ultrasound examinations were
performed for complete evaluation of the gestation as well as the
maternal uterus, adnexal regions, and pelvic cul-de-sac.
Transvaginal technique was performed to assess early pregnancy.

[Series 1: us ob comp less 14 wks · 14 of 50 slices shown]
[im 2/50]
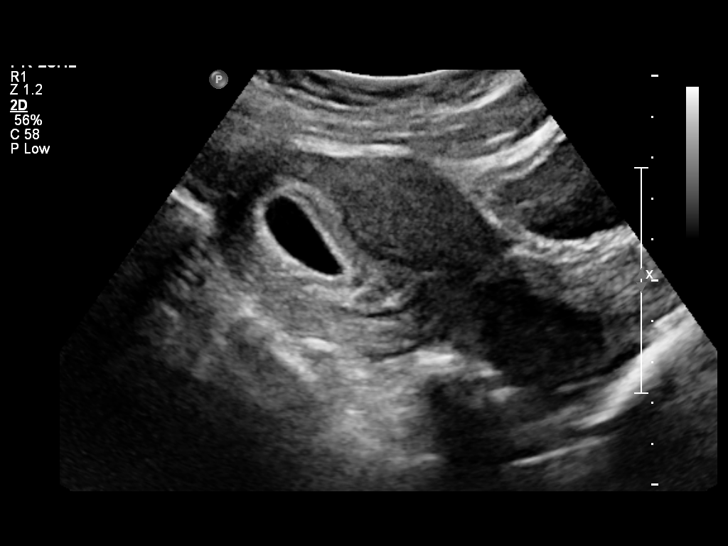
[im 6/50]
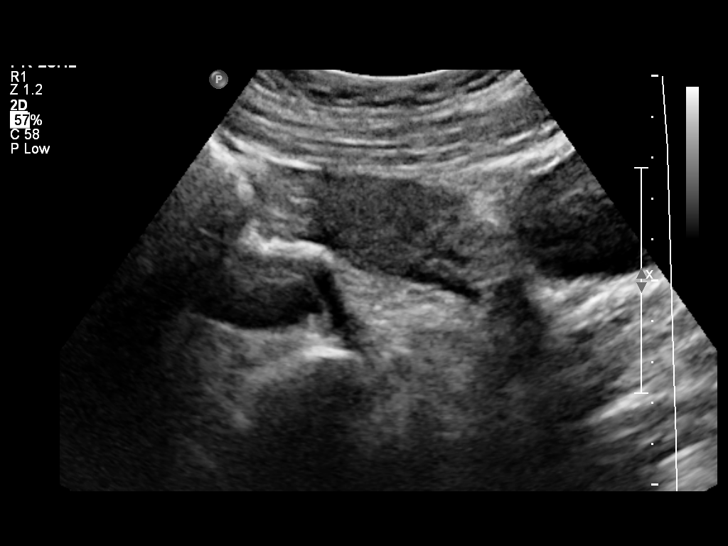
[im 10/50]
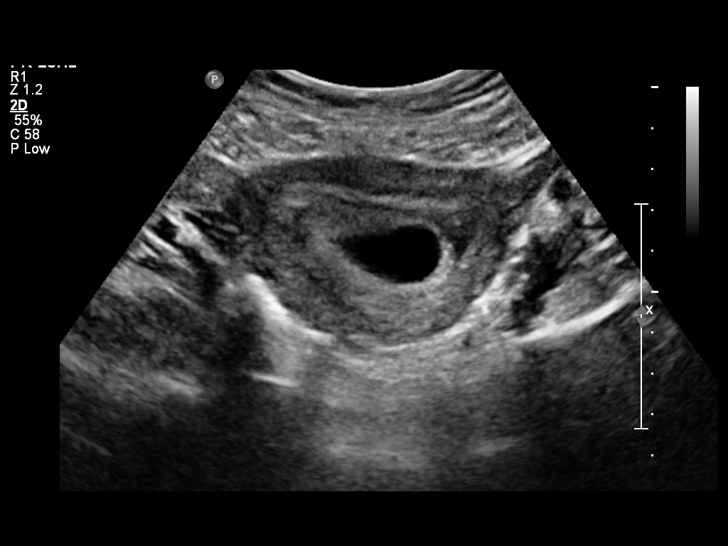
[im 13/50]
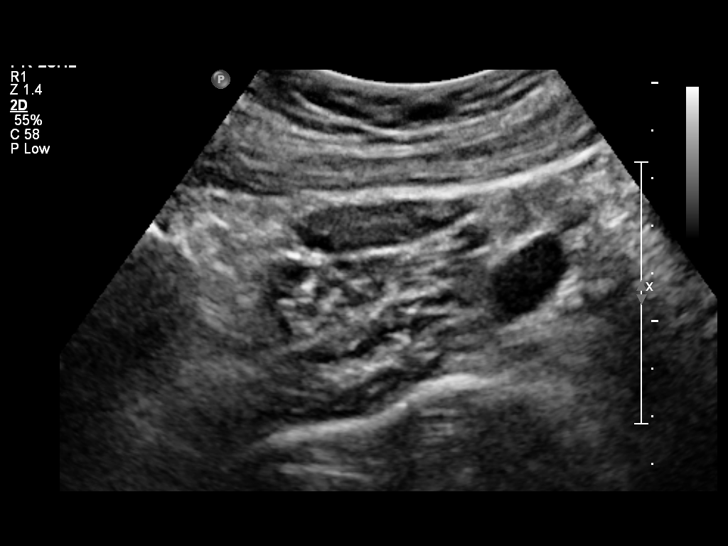
[im 17/50]
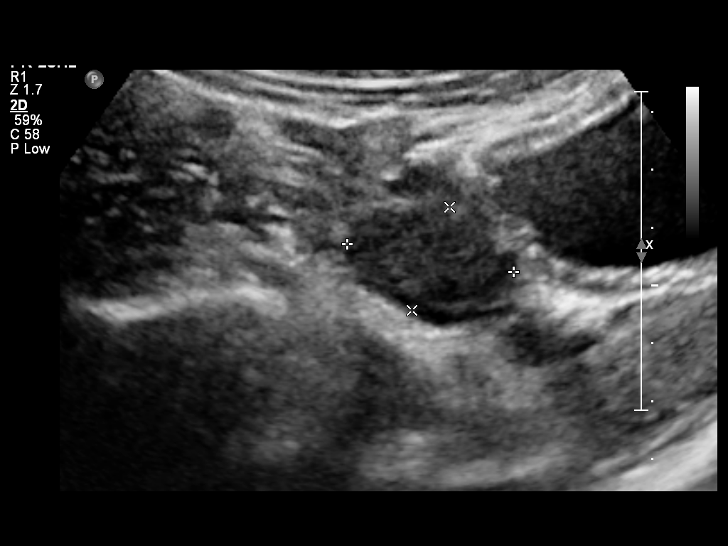
[im 20/50]
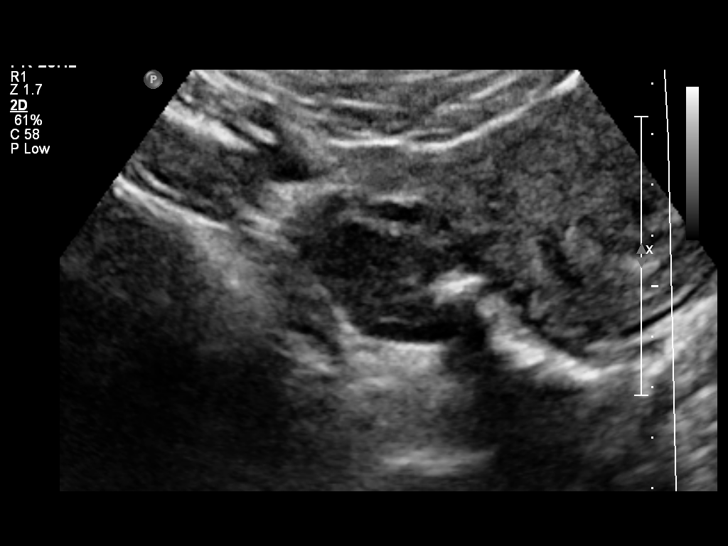
[im 24/50]
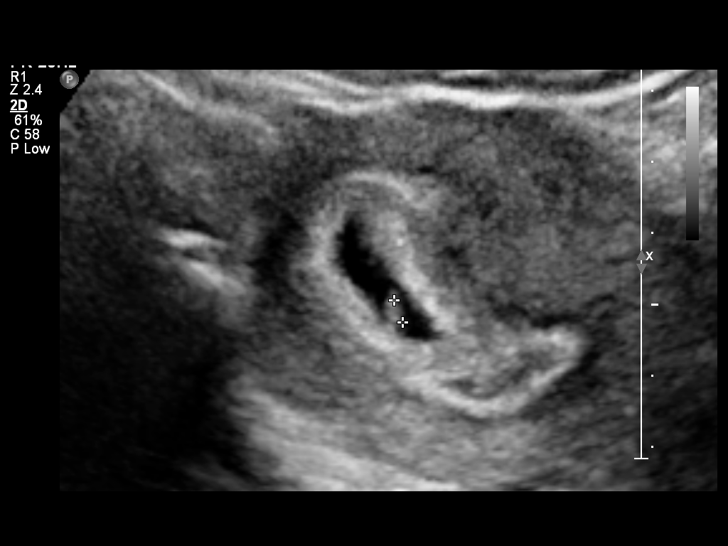
[im 28/50]
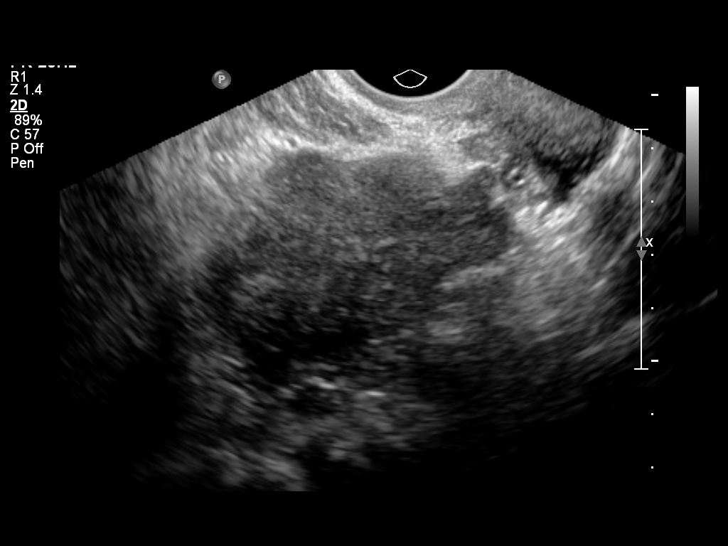
[im 31/50]
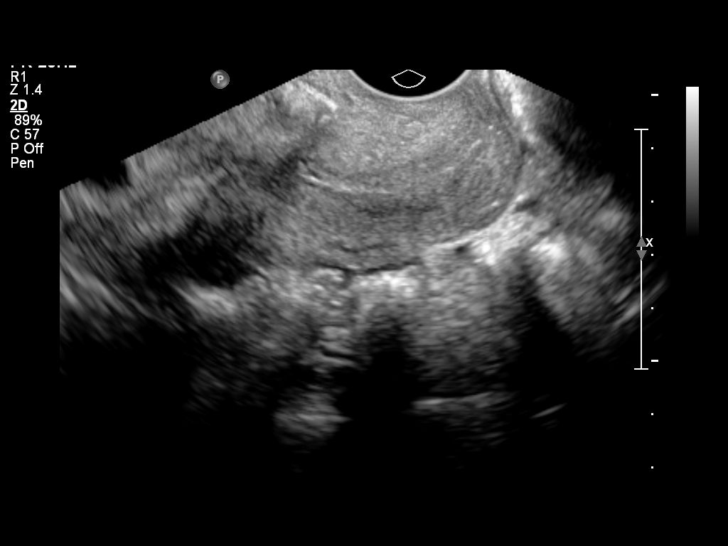
[im 35/50]
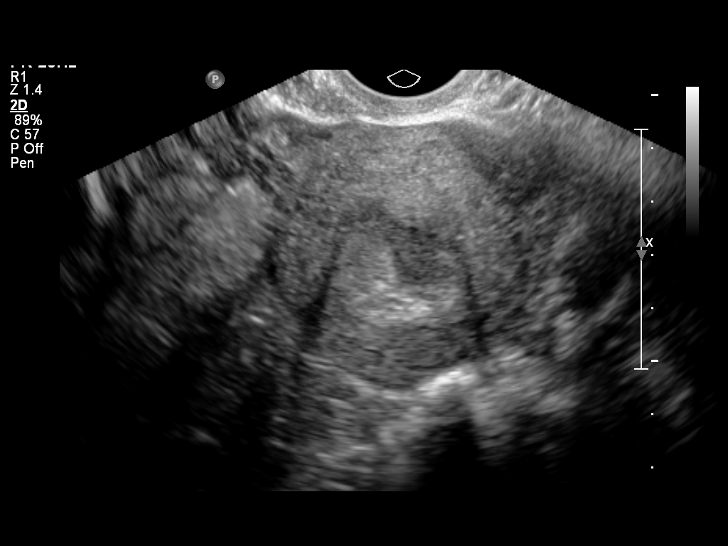
[im 39/50]
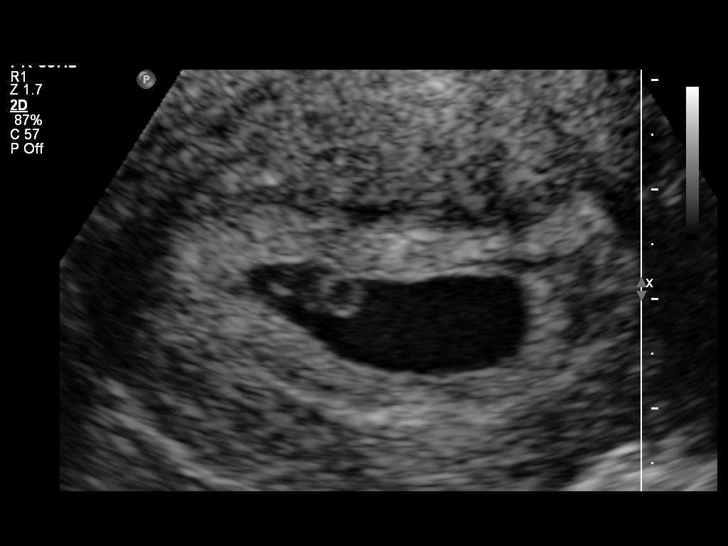
[im 42/50]
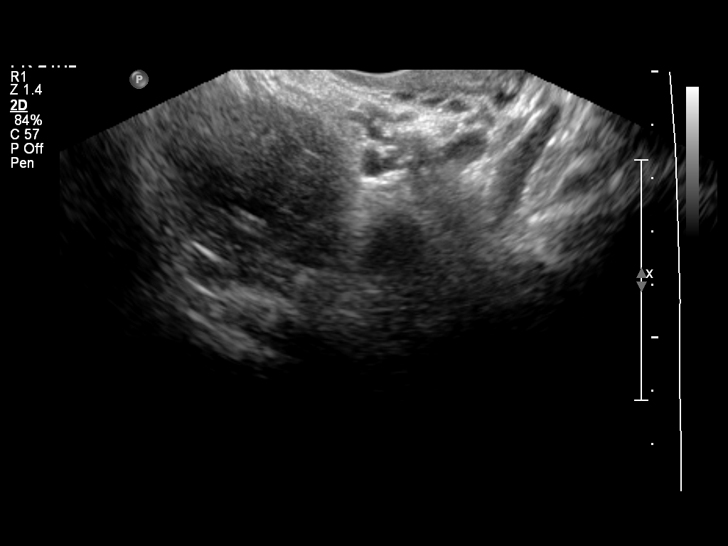
[im 46/50]
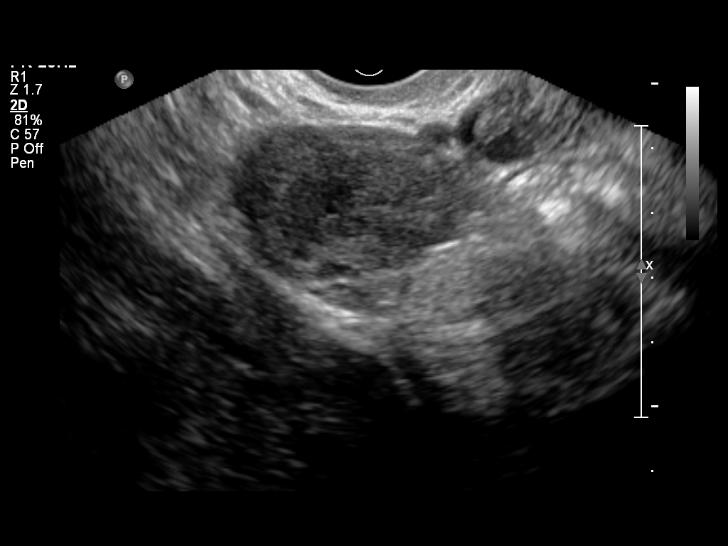
[im 50/50]
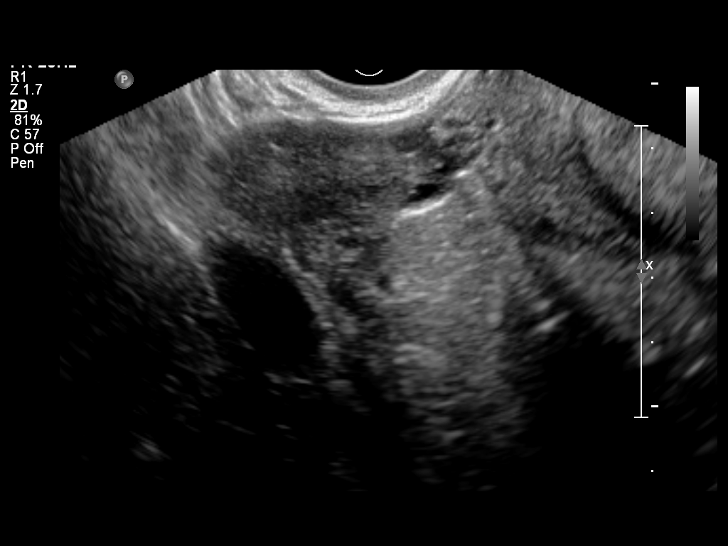

[14 of 28 positions shown; findings below may reference images not displayed]

FINDINGS: Intrauterine gestational sac: Visualized/normal in shape.

Yolk sac:  Visualized

Embryo:  Visualized

Cardiac Activity: Visualized

Heart Rate:  122 bpm

CRL:   4  mm   6 w 1 d                  US EDC: 04/23/2015

Maternal uterus/adnexae: Small subchorionic hemorrhage noted. Both
ovaries are normal in appearance. No mass or free fluid visualized.
IMPRESSION: Single living IUP measuring 6 weeks 1 day with US EDC of 04/23/2015.

Small subchorionic hemorrhage noted. No other significant
abnormality identified.

## 2019-07-11 ENCOUNTER — Inpatient Hospital Stay (HOSPITAL_BASED_OUTPATIENT_CLINIC_OR_DEPARTMENT_OTHER)
Admission: EM | Admit: 2019-07-11 | Discharge: 2019-07-13 | DRG: 787 | Disposition: A | Discharge status: Home / Self Care | Payer: Medicaid Other | Attending: Family Medicine | Admitting: Family Medicine

## 2019-07-11 ENCOUNTER — Encounter (HOSPITAL_BASED_OUTPATIENT_CLINIC_OR_DEPARTMENT_OTHER): Payer: Self-pay

## 2019-07-11 ENCOUNTER — Other Ambulatory Visit: Payer: Self-pay

## 2019-07-11 ENCOUNTER — Encounter (HOSPITAL_COMMUNITY)
Admission: EM | Disposition: A | Discharge status: Home / Self Care | Payer: Self-pay | Source: Home / Self Care | Attending: Family Medicine

## 2019-07-11 ENCOUNTER — Inpatient Hospital Stay (HOSPITAL_COMMUNITY): Payer: Medicaid Other | Admitting: Anesthesiology

## 2019-07-11 ENCOUNTER — Inpatient Hospital Stay (HOSPITAL_COMMUNITY): Payer: Medicaid Other

## 2019-07-11 DIAGNOSIS — O4693 Antepartum hemorrhage, unspecified, third trimester: Secondary | ICD-10-CM

## 2019-07-11 DIAGNOSIS — O26899 Other specified pregnancy related conditions, unspecified trimester: Secondary | ICD-10-CM

## 2019-07-11 DIAGNOSIS — O34219 Maternal care for unspecified type scar from previous cesarean delivery: Secondary | ICD-10-CM

## 2019-07-11 DIAGNOSIS — Z3A32 32 weeks gestation of pregnancy: Secondary | ICD-10-CM

## 2019-07-11 DIAGNOSIS — Z20828 Contact with and (suspected) exposure to other viral communicable diseases: Secondary | ICD-10-CM | POA: Diagnosis present

## 2019-07-11 DIAGNOSIS — O0933 Supervision of pregnancy with insufficient antenatal care, third trimester: Secondary | ICD-10-CM

## 2019-07-11 DIAGNOSIS — O4700 False labor before 37 completed weeks of gestation, unspecified trimester: Secondary | ICD-10-CM

## 2019-07-11 DIAGNOSIS — O99324 Drug use complicating childbirth: Secondary | ICD-10-CM | POA: Diagnosis present

## 2019-07-11 DIAGNOSIS — O321XX0 Maternal care for breech presentation, not applicable or unspecified: Secondary | ICD-10-CM | POA: Diagnosis present

## 2019-07-11 DIAGNOSIS — F1490 Cocaine use, unspecified, uncomplicated: Secondary | ICD-10-CM | POA: Diagnosis present

## 2019-07-11 DIAGNOSIS — O479 False labor, unspecified: Secondary | ICD-10-CM

## 2019-07-11 DIAGNOSIS — N939 Abnormal uterine and vaginal bleeding, unspecified: Secondary | ICD-10-CM

## 2019-07-11 DIAGNOSIS — Z87891 Personal history of nicotine dependence: Secondary | ICD-10-CM

## 2019-07-11 DIAGNOSIS — F1190 Opioid use, unspecified, uncomplicated: Secondary | ICD-10-CM | POA: Diagnosis present

## 2019-07-11 DIAGNOSIS — Z98891 History of uterine scar from previous surgery: Secondary | ICD-10-CM

## 2019-07-11 DIAGNOSIS — O34211 Maternal care for low transverse scar from previous cesarean delivery: Secondary | ICD-10-CM | POA: Diagnosis not present

## 2019-07-11 DIAGNOSIS — O99323 Drug use complicating pregnancy, third trimester: Secondary | ICD-10-CM

## 2019-07-11 DIAGNOSIS — Z9189 Other specified personal risk factors, not elsewhere classified: Secondary | ICD-10-CM

## 2019-07-11 DIAGNOSIS — O99891 Other specified diseases and conditions complicating pregnancy: Secondary | ICD-10-CM

## 2019-07-11 LAB — URINALYSIS, ROUTINE W REFLEX MICROSCOPIC
Bilirubin Urine: NEGATIVE
Glucose, UA: 50 mg/dL — AB
Ketones, ur: NEGATIVE mg/dL
Leukocytes,Ua: NEGATIVE
Nitrite: NEGATIVE
Protein, ur: NEGATIVE mg/dL
Specific Gravity, Urine: 1.006 (ref 1.005–1.030)
pH: 7 (ref 5.0–8.0)

## 2019-07-11 LAB — RAPID URINE DRUG SCREEN, HOSP PERFORMED
Amphetamines: NOT DETECTED
Barbiturates: NOT DETECTED
Benzodiazepines: POSITIVE — AB
Cocaine: POSITIVE — AB
Opiates: POSITIVE — AB
Tetrahydrocannabinol: NOT DETECTED

## 2019-07-11 LAB — TYPE AND SCREEN
ABO/RH(D): A POS
Antibody Screen: NEGATIVE

## 2019-07-11 LAB — CBC
HCT: 30.2 % — ABNORMAL LOW (ref 36.0–46.0)
Hemoglobin: 10.1 g/dL — ABNORMAL LOW (ref 12.0–15.0)
MCH: 28.9 pg (ref 26.0–34.0)
MCHC: 33.4 g/dL (ref 30.0–36.0)
MCV: 86.5 fL (ref 80.0–100.0)
Platelets: 297 10*3/uL (ref 150–400)
RBC: 3.49 MIL/uL — ABNORMAL LOW (ref 3.87–5.11)
RDW: 13.7 % (ref 11.5–15.5)
WBC: 23.7 10*3/uL — ABNORMAL HIGH (ref 4.0–10.5)
nRBC: 0 % (ref 0.0–0.2)

## 2019-07-11 LAB — SARS CORONAVIRUS 2 BY RT PCR (HOSPITAL ORDER, PERFORMED IN ~~LOC~~ HOSPITAL LAB): SARS Coronavirus 2: NEGATIVE

## 2019-07-11 LAB — HEPATITIS B SURFACE ANTIGEN: Hepatitis B Surface Ag: NONREACTIVE

## 2019-07-11 LAB — HEPATITIS C ANTIBODY: HCV Ab: REACTIVE — AB

## 2019-07-11 LAB — HIV ANTIBODY (ROUTINE TESTING W REFLEX): HIV Screen 4th Generation wRfx: NONREACTIVE

## 2019-07-11 LAB — ABO/RH: ABO/RH(D): A POS

## 2019-07-11 SURGERY — Surgical Case
Anesthesia: General

## 2019-07-11 MED ORDER — DIPHENHYDRAMINE HCL 25 MG PO CAPS: 25.0000 mg | ORAL_CAPSULE | ORAL | Status: DC | PRN

## 2019-07-11 MED ORDER — ONDANSETRON HCL 4 MG/2ML IJ SOLN
INTRAMUSCULAR | Status: DC | PRN
  Administered 2019-07-11: 4 mg via INTRAVENOUS

## 2019-07-11 MED ORDER — KETAMINE HCL 50 MG/5ML IJ SOSY
PREFILLED_SYRINGE | INTRAMUSCULAR | Status: AC
  Filled 2019-07-11: qty 5

## 2019-07-11 MED ORDER — ACETAMINOPHEN 10 MG/ML IV SOLN
1000.0000 mg | Freq: Once | INTRAVENOUS | Status: DC | PRN
  Administered 2019-07-11: 21:00:00 1000 mg via INTRAVENOUS

## 2019-07-11 MED ORDER — GLYCOPYRROLATE 0.2 MG/ML IJ SOLN
INTRAMUSCULAR | Status: DC | PRN
  Administered 2019-07-11: 0.1 mg via INTRAVENOUS

## 2019-07-11 MED ORDER — DIPHENHYDRAMINE HCL 25 MG PO CAPS: 25.0000 mg | ORAL_CAPSULE | Freq: Four times a day (QID) | ORAL | Status: DC | PRN

## 2019-07-11 MED ORDER — FENTANYL CITRATE (PF) 250 MCG/5ML IJ SOLN
INTRAMUSCULAR | Status: AC
  Filled 2019-07-11: qty 5

## 2019-07-11 MED ORDER — KETOROLAC TROMETHAMINE 30 MG/ML IJ SOLN: 30.0000 mg | Freq: Four times a day (QID) | INTRAMUSCULAR | Status: AC | PRN

## 2019-07-11 MED ORDER — SIMETHICONE 80 MG PO CHEW
80.0000 mg | CHEWABLE_TABLET | ORAL | Status: DC
  Administered 2019-07-12: 80 mg via ORAL
  Filled 2019-07-11: qty 1

## 2019-07-11 MED ORDER — ONDANSETRON HCL 4 MG/2ML IJ SOLN: 4.0000 mg | Freq: Four times a day (QID) | INTRAMUSCULAR | Status: DC | PRN

## 2019-07-11 MED ORDER — PRENATAL MULTIVITAMIN CH
1.0000 | ORAL_TABLET | Freq: Every day | ORAL | Status: DC
  Administered 2019-07-12: 12:00:00 1 via ORAL
  Filled 2019-07-11: qty 1

## 2019-07-11 MED ORDER — SCOPOLAMINE 1 MG/3DAYS TD PT72
MEDICATED_PATCH | TRANSDERMAL | Status: DC | PRN
  Administered 2019-07-11: 1 via TRANSDERMAL

## 2019-07-11 MED ORDER — FENTANYL CITRATE (PF) 100 MCG/2ML IJ SOLN: 25.0000 ug | INTRAMUSCULAR | Status: DC | PRN

## 2019-07-11 MED ORDER — KETOROLAC TROMETHAMINE 30 MG/ML IJ SOLN
30.0000 mg | Freq: Four times a day (QID) | INTRAMUSCULAR | Status: AC | PRN
  Administered 2019-07-11: 22:00:00 30 mg via INTRAVENOUS

## 2019-07-11 MED ORDER — DIPHENHYDRAMINE HCL 50 MG/ML IJ SOLN: 12.5000 mg | Freq: Four times a day (QID) | INTRAMUSCULAR | Status: DC | PRN

## 2019-07-11 MED ORDER — DIPHENHYDRAMINE HCL 12.5 MG/5ML PO ELIX: 12.5000 mg | ORAL_SOLUTION | Freq: Four times a day (QID) | ORAL | Status: DC | PRN

## 2019-07-11 MED ORDER — SOD CITRATE-CITRIC ACID 500-334 MG/5ML PO SOLN
30.0000 mL | Freq: Once | ORAL | Status: AC
  Administered 2019-07-11: 19:00:00 30 mL via ORAL

## 2019-07-11 MED ORDER — ZOLPIDEM TARTRATE 5 MG PO TABS: 5.0000 mg | ORAL_TABLET | Freq: Every evening | ORAL | Status: DC | PRN

## 2019-07-11 MED ORDER — PHENYLEPHRINE HCL (PRESSORS) 10 MG/ML IV SOLN
INTRAVENOUS | Status: DC | PRN
  Administered 2019-07-11 (×2): 80 ug via INTRAVENOUS

## 2019-07-11 MED ORDER — FENTANYL CITRATE (PF) 100 MCG/2ML IJ SOLN
INTRAMUSCULAR | Status: DC | PRN
  Administered 2019-07-11 (×2): 50 ug via INTRAVENOUS
  Administered 2019-07-11: 100 ug via INTRAVENOUS
  Administered 2019-07-11 (×2): 50 ug via INTRAVENOUS
  Administered 2019-07-11: 150 ug via INTRAVENOUS
  Administered 2019-07-11 (×2): 50 ug via INTRAVENOUS

## 2019-07-11 MED ORDER — CEFAZOLIN SODIUM-DEXTROSE 2-4 GM/100ML-% IV SOLN
2.0000 g | Freq: Once | INTRAVENOUS | Status: AC
  Administered 2019-07-11: 2 g via INTRAVENOUS
  Filled 2019-07-11: qty 100

## 2019-07-11 MED ORDER — NALBUPHINE HCL 10 MG/ML IJ SOLN
5.0000 mg | INTRAMUSCULAR | Status: DC | PRN
  Filled 2019-07-11: qty 0.5

## 2019-07-11 MED ORDER — SENNOSIDES-DOCUSATE SODIUM 8.6-50 MG PO TABS
2.0000 | ORAL_TABLET | ORAL | Status: DC
  Administered 2019-07-12 (×2): 2 via ORAL
  Filled 2019-07-11 (×2): qty 2

## 2019-07-11 MED ORDER — KETOROLAC TROMETHAMINE 30 MG/ML IJ SOLN: 30.0000 mg | Freq: Once | INTRAMUSCULAR | Status: DC | PRN

## 2019-07-11 MED ORDER — SODIUM CHLORIDE 0.9 % IV BOLUS: 1000.0000 mL | Freq: Once | INTRAVENOUS | Status: DC

## 2019-07-11 MED ORDER — TETANUS-DIPHTH-ACELL PERTUSSIS 5-2.5-18.5 LF-MCG/0.5 IM SUSP: 0.5000 mL | Freq: Once | INTRAMUSCULAR | Status: DC

## 2019-07-11 MED ORDER — IBUPROFEN 800 MG PO TABS
800.0000 mg | ORAL_TABLET | Freq: Four times a day (QID) | ORAL | Status: DC
  Administered 2019-07-12 – 2019-07-13 (×2): 800 mg via ORAL
  Filled 2019-07-11 (×2): qty 1

## 2019-07-11 MED ORDER — LACTATED RINGERS IV SOLN
INTRAVENOUS | Status: DC
  Administered 2019-07-12 (×2): via INTRAVENOUS

## 2019-07-11 MED ORDER — TERBUTALINE SULFATE 1 MG/ML IJ SOLN
INTRAMUSCULAR | Status: AC
  Administered 2019-07-11: 19:00:00 0.25 mg via SUBCUTANEOUS
  Filled 2019-07-11: qty 1

## 2019-07-11 MED ORDER — FENTANYL CITRATE (PF) 100 MCG/2ML IJ SOLN
100.0000 ug | Freq: Once | INTRAMUSCULAR | Status: AC
  Administered 2019-07-11: 19:00:00 100 ug via INTRAVENOUS
  Filled 2019-07-11: qty 2

## 2019-07-11 MED ORDER — PROPOFOL 10 MG/ML IV BOLUS
INTRAVENOUS | Status: DC | PRN
  Administered 2019-07-11 (×2): 100 mg via INTRAVENOUS
  Administered 2019-07-11: 200 mg via INTRAVENOUS

## 2019-07-11 MED ORDER — NALOXONE HCL 4 MG/10ML IJ SOLN
1.0000 ug/kg/h | INTRAVENOUS | Status: DC | PRN
  Filled 2019-07-11: qty 5

## 2019-07-11 MED ORDER — BETAMETHASONE SOD PHOS & ACET 6 (3-3) MG/ML IJ SUSP
12.0000 mg | Freq: Once | INTRAMUSCULAR | Status: AC
  Administered 2019-07-11: 19:00:00 12 mg via INTRAMUSCULAR
  Filled 2019-07-11: qty 5

## 2019-07-11 MED ORDER — SCOPOLAMINE 1 MG/3DAYS TD PT72: 1.0000 | MEDICATED_PATCH | Freq: Once | TRANSDERMAL | Status: DC

## 2019-07-11 MED ORDER — KETOROLAC TROMETHAMINE 30 MG/ML IJ SOLN
INTRAMUSCULAR | Status: AC
  Filled 2019-07-11: qty 1

## 2019-07-11 MED ORDER — OXYCODONE-ACETAMINOPHEN 5-325 MG PO TABS: 1.0000 | ORAL_TABLET | ORAL | Status: DC | PRN

## 2019-07-11 MED ORDER — OXYTOCIN 40 UNITS IN NORMAL SALINE INFUSION - SIMPLE MED: 2.5000 [IU]/h | INTRAVENOUS | Status: AC

## 2019-07-11 MED ORDER — HYDROMORPHONE HCL 1 MG/ML IJ SOLN
INTRAMUSCULAR | Status: DC | PRN
  Administered 2019-07-11 (×2): .5 mg via INTRAVENOUS
  Administered 2019-07-11 (×2): 0.5 mg via INTRAVENOUS

## 2019-07-11 MED ORDER — HYDROMORPHONE HCL 1 MG/ML IJ SOLN
INTRAMUSCULAR | Status: AC
  Filled 2019-07-11: qty 1

## 2019-07-11 MED ORDER — DIPHENHYDRAMINE HCL 50 MG/ML IJ SOLN: 12.5000 mg | INTRAMUSCULAR | Status: DC | PRN

## 2019-07-11 MED ORDER — ONDANSETRON HCL 4 MG/2ML IJ SOLN: 4.0000 mg | Freq: Three times a day (TID) | INTRAMUSCULAR | Status: DC | PRN

## 2019-07-11 MED ORDER — ACETAMINOPHEN 500 MG PO TABS
1000.0000 mg | ORAL_TABLET | Freq: Four times a day (QID) | ORAL | Status: AC
  Administered 2019-07-12 (×3): 1000 mg via ORAL
  Filled 2019-07-11 (×3): qty 2

## 2019-07-11 MED ORDER — NALBUPHINE HCL 10 MG/ML IJ SOLN
5.0000 mg | Freq: Once | INTRAMUSCULAR | Status: DC | PRN
  Filled 2019-07-11: qty 0.5

## 2019-07-11 MED ORDER — LIDOCAINE HCL (CARDIAC) PF 100 MG/5ML IV SOSY
PREFILLED_SYRINGE | INTRAVENOUS | Status: DC | PRN
  Administered 2019-07-11: 100 mg via INTRAVENOUS

## 2019-07-11 MED ORDER — HYDROMORPHONE 1 MG/ML IV SOLN
INTRAVENOUS | Status: DC
  Administered 2019-07-12: 30 mg via INTRAVENOUS
  Administered 2019-07-12: 2.7 mg via INTRAVENOUS
  Administered 2019-07-12: 0.6 mg via INTRAVENOUS
  Administered 2019-07-12: 3 mg via INTRAVENOUS
  Administered 2019-07-12: 1.8 mg via INTRAVENOUS
  Filled 2019-07-11: qty 30

## 2019-07-11 MED ORDER — OXYTOCIN 40 UNITS IN NORMAL SALINE INFUSION - SIMPLE MED
INTRAVENOUS | Status: DC | PRN
  Administered 2019-07-11: 40 [IU] via INTRAVENOUS

## 2019-07-11 MED ORDER — MENTHOL 3 MG MT LOZG: 1.0000 | LOZENGE | OROMUCOSAL | Status: DC | PRN

## 2019-07-11 MED ORDER — MEPERIDINE HCL 25 MG/ML IJ SOLN: 6.2500 mg | INTRAMUSCULAR | Status: DC | PRN

## 2019-07-11 MED ORDER — WITCH HAZEL-GLYCERIN EX PADS: 1.0000 "application " | MEDICATED_PAD | CUTANEOUS | Status: DC | PRN

## 2019-07-11 MED ORDER — SUCCINYLCHOLINE CHLORIDE 20 MG/ML IJ SOLN
INTRAMUSCULAR | Status: DC | PRN
  Administered 2019-07-11: 100 mg via INTRAVENOUS

## 2019-07-11 MED ORDER — SODIUM CHLORIDE 0.9% FLUSH: 3.0000 mL | INTRAVENOUS | Status: DC | PRN

## 2019-07-11 MED ORDER — DEXAMETHASONE SODIUM PHOSPHATE 10 MG/ML IJ SOLN
INTRAMUSCULAR | Status: DC | PRN
  Administered 2019-07-11: 4 mg via INTRAVENOUS

## 2019-07-11 MED ORDER — KETOROLAC TROMETHAMINE 30 MG/ML IJ SOLN
30.0000 mg | Freq: Four times a day (QID) | INTRAMUSCULAR | Status: AC
  Administered 2019-07-12 (×3): 30 mg via INTRAVENOUS
  Filled 2019-07-11 (×3): qty 1

## 2019-07-11 MED ORDER — DIBUCAINE (PERIANAL) 1 % EX OINT: 1.0000 "application " | TOPICAL_OINTMENT | CUTANEOUS | Status: DC | PRN

## 2019-07-11 MED ORDER — TERBUTALINE SULFATE 1 MG/ML IJ SOLN
0.2500 mg | Freq: Once | INTRAMUSCULAR | Status: AC
  Administered 2019-07-11: 19:00:00 0.25 mg via SUBCUTANEOUS

## 2019-07-11 MED ORDER — LACTATED RINGERS IV SOLN
INTRAVENOUS | Status: DC
  Administered 2019-07-11 (×3): via INTRAVENOUS

## 2019-07-11 MED ORDER — SIMETHICONE 80 MG PO CHEW
80.0000 mg | CHEWABLE_TABLET | ORAL | Status: DC | PRN
  Administered 2019-07-13: 10:00:00 80 mg via ORAL
  Filled 2019-07-11: qty 1

## 2019-07-11 MED ORDER — NALOXONE HCL 0.4 MG/ML IJ SOLN: 0.4000 mg | INTRAMUSCULAR | Status: DC | PRN

## 2019-07-11 MED ORDER — KETAMINE HCL 10 MG/ML IJ SOLN
INTRAMUSCULAR | Status: DC | PRN
  Administered 2019-07-11: 30 mg via INTRAVENOUS
  Administered 2019-07-11: 20 mg via INTRAVENOUS

## 2019-07-11 MED ORDER — MIDAZOLAM HCL 2 MG/2ML IJ SOLN
INTRAMUSCULAR | Status: AC
  Filled 2019-07-11: qty 2

## 2019-07-11 MED ORDER — SODIUM CHLORIDE 0.9% FLUSH: 9.0000 mL | INTRAVENOUS | Status: DC | PRN

## 2019-07-11 MED ORDER — SOD CITRATE-CITRIC ACID 500-334 MG/5ML PO SOLN
ORAL | Status: AC
  Filled 2019-07-11: qty 30

## 2019-07-11 MED ORDER — ENOXAPARIN SODIUM 40 MG/0.4ML ~~LOC~~ SOLN
40.0000 mg | SUBCUTANEOUS | Status: DC
  Filled 2019-07-11: qty 0.4

## 2019-07-11 MED ORDER — SODIUM CHLORIDE 0.9 % IV SOLN
INTRAVENOUS | Status: DC | PRN
  Administered 2019-07-11: 20:00:00 via INTRAVENOUS

## 2019-07-11 MED ORDER — ACETAMINOPHEN 10 MG/ML IV SOLN
INTRAVENOUS | Status: AC
  Filled 2019-07-11: qty 100

## 2019-07-11 MED ORDER — COCONUT OIL OIL: 1.0000 "application " | TOPICAL_OIL | Status: DC | PRN

## 2019-07-11 MED ORDER — SODIUM CHLORIDE 0.9 % IR SOLN
Status: DC | PRN
  Administered 2019-07-11 (×2): 1

## 2019-07-11 MED ORDER — SIMETHICONE 80 MG PO CHEW
80.0000 mg | CHEWABLE_TABLET | Freq: Three times a day (TID) | ORAL | Status: DC
  Administered 2019-07-12 (×2): 80 mg via ORAL
  Filled 2019-07-11 (×2): qty 1

## 2019-07-11 SURGICAL SUPPLY — 33 items
BENZOIN TINCTURE PRP APPL 2/3 (GAUZE/BANDAGES/DRESSINGS) ×3 IMPLANT
CHLORAPREP W/TINT 26ML (MISCELLANEOUS) ×3 IMPLANT
CLAMP CORD UMBIL (MISCELLANEOUS) IMPLANT
CLOSURE WOUND 1/2 X4 (GAUZE/BANDAGES/DRESSINGS) ×1
CLOTH BEACON ORANGE TIMEOUT ST (SAFETY) ×3 IMPLANT
DRSG OPSITE POSTOP 4X10 (GAUZE/BANDAGES/DRESSINGS) ×3 IMPLANT
ELECT REM PT RETURN 9FT ADLT (ELECTROSURGICAL) ×3
ELECTRODE REM PT RTRN 9FT ADLT (ELECTROSURGICAL) ×1 IMPLANT
EXTRACTOR VACUUM M CUP 4 TUBE (SUCTIONS) IMPLANT
EXTRACTOR VACUUM M CUP 4' TUBE (SUCTIONS)
GLOVE BIOGEL PI IND STRL 7.0 (GLOVE) ×2 IMPLANT
GLOVE BIOGEL PI IND STRL 7.5 (GLOVE) ×2 IMPLANT
GLOVE BIOGEL PI INDICATOR 7.0 (GLOVE) ×4
GLOVE BIOGEL PI INDICATOR 7.5 (GLOVE) ×4
GLOVE ECLIPSE 7.5 STRL STRAW (GLOVE) ×3 IMPLANT
GOWN STRL REUS W/TWL LRG LVL3 (GOWN DISPOSABLE) ×9 IMPLANT
HEMOSTAT ARISTA ABSORB 3G PWDR (HEMOSTASIS) ×3 IMPLANT
KIT ABG SYR 3ML LUER SLIP (SYRINGE) IMPLANT
NEEDLE HYPO 25X5/8 SAFETYGLIDE (NEEDLE) IMPLANT
NS IRRIG 1000ML POUR BTL (IV SOLUTION) ×3 IMPLANT
PACK C SECTION WH (CUSTOM PROCEDURE TRAY) ×3 IMPLANT
PAD OB MATERNITY 4.3X12.25 (PERSONAL CARE ITEMS) ×3 IMPLANT
PENCIL SMOKE EVAC W/HOLSTER (ELECTROSURGICAL) ×3 IMPLANT
RTRCTR C-SECT PINK 25CM LRG (MISCELLANEOUS) ×3 IMPLANT
STRIP CLOSURE SKIN 1/2X4 (GAUZE/BANDAGES/DRESSINGS) ×2 IMPLANT
SUT VIC AB 0 CTX 36 (SUTURE) ×6
SUT VIC AB 0 CTX36XBRD ANBCTRL (SUTURE) ×3 IMPLANT
SUT VIC AB 2-0 CT1 27 (SUTURE) ×2
SUT VIC AB 2-0 CT1 TAPERPNT 27 (SUTURE) ×1 IMPLANT
SUT VIC AB 4-0 KS 27 (SUTURE) ×3 IMPLANT
TOWEL OR 17X24 6PK STRL BLUE (TOWEL DISPOSABLE) ×3 IMPLANT
TRAY FOLEY W/BAG SLVR 14FR LF (SET/KITS/TRAYS/PACK) ×3 IMPLANT
WATER STERILE IRR 1000ML POUR (IV SOLUTION) ×6 IMPLANT

## 2019-07-11 NOTE — Op Note (Addendum)
[facility] PROCEDURE DATE: 07/11/2019  PREOPERATIVE DIAGNOSIS: Intrauterine pregnancy at  [redacted] weeks gestation by [place] today; preterm labor, 2 prior cesarean deliveries.  POSTOPERATIVE DIAGNOSIS: The same  PROCEDURE: Repeat Low Transverse Cesarean Section  SURGEON:  Dr. Candelaria Celeste  ASSISTANT: Dr Reina Fuse  INDICATIONS: GRACIEMAE Tran is a 33 y.o. [id number] at 32 weeks scheduled for cesarean section secondary to preterm labor and 2 prior sections.  The risks of cesarean section discussed with the patient included but were not limited to: bleeding which may require transfusion or reoperation; infection which may require antibiotics; injury to bowel, bladder, ureters or other surrounding organs; injury to the fetus; need for additional procedures including hysterectomy in the event of a life-threatening hemorrhage; placental abnormalities wth subsequent pregnancies, incisional problems, thromboembolic phenomenon and other postoperative/anesthesia complications. The patient concurred with the proposed plan, giving informed written consent for the procedure.    FINDINGS:  Viable female infant in vertex presentation.  Apgars 1 and 9, weight 5 pounds and 9 ounces.  Clear amniotic fluid.  Intact placenta, three vessel cord.  Normal uterus, fallopian tubes and ovaries bilaterally.  ANESTHESIA:    General INTRAVENOUS FLUIDS: 1400 ml ESTIMATED BLOOD LOSS: 236 ml URINE OUTPUT:  300 ml SPECIMENS: Placenta sent to pathology COMPLICATIONS: None immediate  PROCEDURE IN DETAIL:  The patient received intravenous antibiotics and had sequential compression devices applied to her lower extremities while in the preoperative area.  She was then taken to the operating room where foley catheter was placed and the skin was prepped and draped after a Timeout had been done. General anesthesia was administered and was found to be adequate. A Pfannenstiel skin incision was made with scalpel and carried through to the  underlying layer of fascia. The fascia was incised in the midline and this incision was extended bilaterally using the Mayo scissors. Kocher clamps were applied to the superior aspect of the fascial incision and the underlying rectus muscles were dissected off bluntly. A similar process was carried out on the inferior aspect of the facial incision. The rectus muscles were separated in the midline bluntly and the peritoneum was entered bluntly. An Alexis retractor was placed to aid in visualization of the uterus.  Attention was turned to the lower uterine segment where a transverse hysterotomy was made with a scalpel and extended bilaterally bluntly. The baby's head was felt in the pelvis, but was unable to reach around in order to elevate out of the pelvis. Help from nursing staff was requested to help elevated the baby's head, but this was unsuccessful. Attempts were made to reach for the baby's feet in order to delivery breech, but this was initially unsuccessful. At this time, call made for assistance was made. Bandage scissors were used to extend the hysterotomy in a cephalad direction on both sides. Both feet were obtained and delivered, but the head was still entrapped. Dr Reina Fuse arrived and assisted in counterpressure just superior to the ASIS and the head was delivered. The cord was immediately clamped and cut and infant was handed over to awaiting neonatology team. Uterine massage was then administered and the placenta delivered intact with three-vessel cord. The uterus was then cleared of clot and debris.  The hysterotomy was closed with 0 Vicryl in a running locked fashion, and an imbricating layer was also placed with a 0 Vicryl. Overall, excellent hemostasis was noted. The abdomen and the pelvis were cleared of all clot and debris and the Jon Gills was removed. Arixtra was placed in  the pelvis over the hysterotomy to aid in hemostasis. Hemostasis was confirmed on all surfaces.  The peritoneum was  reapproximated using 2-0 vicryl running stitches. The fascia was then closed using 0 Vicryl in a running fashion. The skin was closed with 4-0 vicryl. The patient tolerated the procedure well. Sponge, lap, instrument and needle counts were correct x 2. She was taken to the recovery room in stable condition.   An experienced assistant was required given the standard of surgical care given the complexity of the case.  This assistant was needed for exposure, dissection, suctioning, retraction, instrument exchange, assisting with delivery with administration of fundal pressure, and for overall help during the procedure.  Skin incision: 1951 Hysterotomy: 1954 Delivery time: 2000  Truett Mainland, DO 07/11/2019 8:41 PM

## 2019-07-11 NOTE — H&P (Signed)
Faculty Practice H&P  Joan Tran is a 33 y.o. female [phone] with IUP at Unknown gestational age transferred from [facility] due to contractions and vaginal bleeding. Pregnancy was been complicated by no prenatal care, history of opiate abuse, 2 prior cesarean sections. Contractions started 2 days ago. Has been taking percocet for the pain, but unable to tell me how much.    Pt states she has been having regular contractions, no vaginal bleeding, intact membranes, with normal fetal movement.     Prenatal Course Source of Care: none  Pregnancy complications or risks: Patient Active Problem List   Diagnosis Date Noted  . S/P cesarean section 04/15/2015  . Pregnancy 08/29/2014   She desires oral progesterone-only contraceptive for contraception.  She plans to bottle feed  Prenatal labs and studies: ABO, Rh:   Antibody:   Rubella:   RPR:    HBsAg:    HIV:    GBS:     Past Medical History:  Past Medical History:  Diagnosis Date  . PONV (postoperative nausea and vomiting)     Past Surgical History:  Past Surgical History:  Procedure Laterality Date  . CESAREAN SECTION    . CESAREAN SECTION N/A 04/15/2015   Procedure: CESAREAN SECTION;  Surgeon: Lavina Hamman, MD;  Location: [facility];  Service: Obstetrics;  Laterality: N/A;  Tracie S. RNFA confirmed 03/22/15     Obstetrical History:  OB History    Gravida  4   Para  2   Term  2   Preterm      AB  1   Living  1     SAB      TAB  1   Ectopic      Multiple  0   Live Births  1           Gynecological History:  OB History    Gravida  4   Para  2   Term  2   Preterm      AB  1   Living  1     SAB      TAB  1   Ectopic      Multiple  0   Live Births  1           Social History:  Social History   Socioeconomic History  . Marital status: Single    Spouse name: Not on file  . Number of children: Not on file  . Years of education: Not on file  . Highest  education level: Not on file  Occupational History  . Not on file  Social Needs  . Financial resource strain: Not on file  . Food insecurity    Worry: Not on file    Inability: Not on file  . Transportation needs    Medical: Not on file    Non-medical: Not on file  Tobacco Use  . Smoking status: Former Smoker    Quit date: 09/14/2014    Years since quitting: 4.8  . Smokeless tobacco: Never Used  Substance and Sexual Activity  . Alcohol use: No  . Drug use: Yes    Types: Heroin    Comment: stopped in 2012   . Sexual activity: Yes  Lifestyle  . Physical activity    Days per week: Not on file    Minutes per session: Not on file  . Stress: Not on file  Relationships  . Social Musician on phone: Not  on file    Gets together: Not on file    Attends religious service: Not on file    Active member of club or organization: Not on file    Attends meetings of clubs or organizations: Not on file    Relationship status: Not on file  Other Topics Concern  . Not on file  Social History Narrative  . Not on file    Family History: History reviewed. No pertinent family history.  Medications:  Prenatal vitamins,  Current Facility-Administered Medications  Medication Dose Route Frequency Provider Last Rate Last Dose  . betamethasone acetate-betamethasone sodium phosphate (CELESTONE) injection 12 mg  12 mg Intramuscular Once Darlina Rumpf, [place]      . ceFAZolin (ANCEF) IVPB 2g/100 mL premix  2 g Intravenous Once Misty Foutz J, DO      . sodium chloride 0.9 % bolus 1,000 mL  1,000 mL Intravenous Once Tegeler, Gwenyth Allegra, MD        Allergies: No Known Allergies  Review of Systems: - negative  Physical Exam: Blood pressure (!) 129/91, pulse 80, temperature 98.3 F (36.8 C), temperature source Oral, resp. rate 20, height 5\' 2"  (1.575 m), SpO2 100 %, unknown if currently breastfeeding. GENERAL: Well-developed, well-nourished female in no acute distress.  LUNGS:  Clear to auscultation bilaterally.  HEART: Regular rate and rhythm. ABDOMEN: Soft, nontender, nondistended, gravid. Fundal height 33weeks.    Pertinent Labs/Studies:   Lab Results  Component Value Date   WBC 12.5 (H) 04/16/2015   HGB 9.6 (L) 04/16/2015   HCT 29.0 (L) 04/16/2015   MCV 87.6 04/16/2015   PLT 196 04/16/2015    Assessment : Joan Tran is a 32 y.o. G4P2011 at [redacted]w[redacted]d by bedside US today being admitted for cesarean section secondary to prior cesarean and preterm labor.  Plan: The risks of cesarean section discussed with the patient included but were not limited to: bleeding which may require transfusion or reoperation; infection which may require antibiotics; injury to bowel, bladder, ureters or other surrounding organs; injury to the fetus; need for additional procedures including hysterectomy in the event of a life-threatening hemorrhage; placental abnormalities wth subsequent pregnancies, incisional problems, thromboembolic phenomenon and other postoperative/anesthesia complications. The patient concurred with the proposed plan, giving informed written consent for the procedure.   Patient has been NPO since last night and will remain NPO for procedure.  Preoperative prophylactic Ancef ordered on call to the OR.   As patient laboring, will proceed with cesarean delivery prior to return of labs and COVID testing. For safety of staff, will treat patient as PUI until testing returns. Anesthesia aware.   Truett Mainland, DO 07/11/2019, 6:27 PM

## 2019-07-11 NOTE — ED Notes (Signed)
Pt states she is recovering heroin user "years ago" and denies taking anything for pain today.

## 2019-07-11 NOTE — Progress Notes (Signed)
VAST consulted to call pt's nurse. Called unit and spoke with [facility], RN who stated PIV was obtained just now.

## 2019-07-11 NOTE — MAU Note (Signed)
Pt presents to MAU via EMS from [facility] [place] [facility]. States contractions since last night

## 2019-07-11 NOTE — MAU Provider Note (Signed)
History     CSN: [id number]  Arrival date and time: 07/11/19 1614   None     Chief Complaint  Patient presents with  . Pregnant  . Vaginal Bleeding   HPI Joan Tran is 33 y.o. [id number] at unknown gestational age who presents to MAU from [facility] in active labor. No prenatal care this pregnancy. OB history includes primary cesarean for breech in 2010 and elective repeat cesarean in 2014.   Patient endorses two day history of painful contractions. She rates her pain as 10/10. She denies DFM and LOF. She experience new onset scant vaginal bleeding this morning and sought care at [facility].  Patient reports former heroine use, states she has not used since January 2020. She endorses using Percocet at home to manage her contraction pain.  OB History    Gravida  4   Para  2   Term  2   Preterm      AB  1   Living  1     SAB      TAB  1   Ectopic      Multiple  0   Live Births  1           Past Medical History:  Diagnosis Date  . PONV (postoperative nausea and vomiting)     Past Surgical History:  Procedure Laterality Date  . CESAREAN SECTION    . CESAREAN SECTION N/A 04/15/2015   Procedure: CESAREAN SECTION;  Surgeon: Cheri Fowler, MD;  Location: [place] [facility];  Service: Obstetrics;  Laterality: N/A;  Tracie S. RNFA confirmed 03/22/15     History reviewed. No pertinent family history.  Social History   Tobacco Use  . Smoking status: Former Smoker    Quit date: 09/14/2014    Years since quitting: 4.8  . Smokeless tobacco: Never Used  Substance Use Topics  . Alcohol use: No  . Drug use: Yes    Types: Heroin    Comment: stopped in 2012     Allergies: No Known Allergies  Medications Prior to Admission  Medication Sig Dispense Refill Last Dose  . HYDROmorphone (DILAUDID) 2 MG tablet Take 1 tablet (2 mg total) by mouth every 3 (three) hours as needed for severe pain. 30 tablet 0   . ibuprofen (ADVIL,MOTRIN) 600 MG tablet Take 1 tablet (600 mg total) by  mouth every 6 (six) hours. 30 tablet 0   . metoCLOPramide (REGLAN) 10 MG tablet Take 1 tablet (10 mg total) by mouth every 6 (six) hours. (Patient not taking: Reported on 04/01/2015) 30 tablet 0   . Prenatal Vit-Fe Fumarate-FA (PRENATAL MULTIVITAMIN) TABS tablet Take 2 tablets by mouth daily at 12 noon.       Review of Systems  Constitutional: Negative for fever.  Respiratory: Positive for cough. Negative for shortness of breath.   Gastrointestinal: Positive for abdominal pain. Negative for nausea and vomiting.  Musculoskeletal: Negative for back pain.  All other systems reviewed and are negative.  Physical Exam   Blood pressure (!) 129/91, pulse 80, temperature 98.3 F (36.8 C), temperature source Oral, resp. rate 20, height 5\' 2"  (1.575 m), SpO2 100 %, unknown if currently breastfeeding.  Physical Exam  MAU Course  Procedures  --No prenatal care --Fundal height 33cm --Cervix 4.5/60-70%/0 --NPO since last night per patient --IV access achieved, unable to obtain labs --Dr. Nehemiah Settle in department, at bedside to coordinate admission --[place] paged to bedside for OB limited --Actively laboring, cervical change to 5.5-6cm per updated exam  by Dr. Adrian Blackwater --Patient repeatedly refusing external monitors.  Patient Vitals for the past 24 hrs:  BP Temp Temp src Pulse Resp SpO2 Height Weight  07/11/19 1830 - - - - - 100 % - 68 kg  07/11/19 1629 - - - - - - 5\' 2"  (1.575 m) -  07/11/19 1626 (!) 129/91 98.3 F (36.8 C) Oral 80 20 100 % - -   Orders Placed This Encounter  Procedures  . SARS Coronavirus 2 by RT PCR (hospital order, performed in [facility] hospital lab) Nasopharyngeal Nasopharyngeal Swab  . [facility] OB Limited  . CBC with Differential  . Comprehensive metabolic panel  . Urinalysis, Routine w reflex microscopic  . HIV Antibody (routine testing w rflx)  . RPR  . Urine rapid drug screen  . Hepatitis B surface antigen  . Rubella screen  . Hepatitis C antibody  . Type and  screen [facility]   Meds ordered this encounter  Medications  . sodium chloride 0.9 % bolus 1,000 mL  . betamethasone acetate-betamethasone sodium phosphate (CELESTONE) injection 12 mg  . ceFAZolin (ANCEF) IVPB 2g/100 mL premix    Order Specific Question:   Antibiotic Indication:    Answer:   Surgical Prophylaxis  . lactated ringers infusion  . terbutaline (BRETHINE) injection 0.25 mg   Results for orders placed or performed during the hospital encounter of 07/11/19 (from the past 24 hour(s))  SARS Coronavirus 2 by RT PCR (hospital order, performed in [facility] hospital lab) Nasopharyngeal Nasopharyngeal Swab     Status: None   Collection Time: 07/11/19  6:25 PM   Specimen: Nasopharyngeal Swab  Result Value Ref Range   SARS Coronavirus 2 NEGATIVE NEGATIVE   Assessment and Plan  --Prep for OR per Dr. 07/13/19, CNM 07/11/2019, 7:42 PM

## 2019-07-11 NOTE — MAU Note (Signed)
Spoke with Manon Hilding, OB OR coordinator.  Spoke with Dr Lanetta Inch, anes.  Karie Soda NICU charge nurse, Leotis Shames [facility]. OB CRNA - Janet-directed to [employer] Report given: pt presented contracting at [facility], no prenatal care,  Hx of 2 prior c/s (1st breech, 2nd repeat),  Pt not helpful, has removed fetal monitors, will not hold still, had denied drug use at [facility], though has track marks, lots of scarring noted on arms when trying to start IV, IV is going, lab called for blood draw, covid test is in lab.

## 2019-07-11 NOTE — Transfer of Care (Signed)
Immediate Anesthesia Transfer of Care Note  Patient: Joan Tran  Procedure(s) Performed: CESAREAN SECTION (N/A )  Patient Location: PACU  Anesthesia Type:General  Level of Consciousness: awake, alert  and oriented  Airway & Oxygen Therapy: Patient Spontanous Breathing  Post-op Assessment: Report given to RN and Post -op Vital signs reviewed and stable  Post vital signs: Reviewed and stable  Last Vitals:  Vitals Value Taken Time  BP 133/95 07/11/19 2050  Temp 36.4 C 07/11/19 2050  Pulse 68 07/11/19 2100  Resp 23 07/11/19 2100  SpO2 100 % 07/11/19 2100  Vitals shown include unvalidated device data.  Last Pain:  Vitals:   07/11/19 2050  TempSrc: Oral  PainSc: 4          Complications: No apparent anesthesia complications

## 2019-07-11 NOTE — Anesthesia Procedure Notes (Signed)
Procedure Name: Intubation Date/Time: 07/11/2019 7:52 PM Performed by: Genevie Ann, CRNA Pre-anesthesia Checklist: Patient identified, Emergency Drugs available, Suction available, Patient being monitored and Timeout performed Patient Re-evaluated:Patient Re-evaluated prior to induction Oxygen Delivery Method: Circle system utilized Preoxygenation: Pre-oxygenation with 100% oxygen Induction Type: IV induction and Rapid sequence Laryngoscope Size: Glidescope and 3 Grade View: Grade I Tube type: Oral Number of attempts: 1 Placement Confirmation: ETT inserted through vocal cords under direct vision,  positive ETCO2 and breath sounds checked- equal and bilateral Secured at: 21 cm Dental Injury: Teeth and Oropharynx as per pre-operative assessment

## 2019-07-11 NOTE — Anesthesia Preprocedure Evaluation (Addendum)
Anesthesia Evaluation  Patient identified by MRN, date of birth, ID band Patient awake    Reviewed: Allergy & Precautions, NPO status , Patient's Chart, lab work & pertinent test results  History of Anesthesia Complications (+) PONV and history of anesthetic complications  Airway Mallampati: II  TM Distance: >3 FB Neck ROM: Full    Dental no notable dental hx. (+) Teeth Intact, Dental Advisory Given   Pulmonary neg pulmonary ROS, former smoker,    Pulmonary exam normal breath sounds clear to auscultation       Cardiovascular negative cardio ROS Normal cardiovascular exam Rhythm:Regular Rate:Normal     Neuro/Psych negative neurological ROS  negative psych ROS   GI/Hepatic negative GI ROS, (+)     substance abuse  IV drug use,   Endo/Other  negative endocrine ROS  Renal/GU negative Renal ROS  negative genitourinary   Musculoskeletal negative musculoskeletal ROS (+)   Abdominal   Peds  Hematology negative hematology ROS (+)   Anesthesia Other Findings G4P2 presenting in labor. No prenatal care. Previous C/S. Active IV drug use.   Reproductive/Obstetrics (+) Pregnancy                             Anesthesia Physical Anesthesia Plan  ASA: III and emergent  Anesthesia Plan: General   Post-op Pain Management:    Induction: Intravenous and Rapid sequence  PONV Risk Score and Plan: Ondansetron, Dexamethasone, Midazolam, Treatment may vary due to age or medical condition and Scopolamine patch - Pre-op  Airway Management Planned: Oral ETT  Additional Equipment:   Intra-op Plan:   Post-operative Plan: Extubation in OR  Informed Consent: I have reviewed the patients History and Physical, chart, labs and discussed the procedure including the risks, benefits and alternatives for the proposed anesthesia with the patient or authorized representative who has indicated his/her understanding  and acceptance.     Dental advisory given  Plan Discussed with: CRNA  Anesthesia Plan Comments:        Anesthesia Quick Evaluation

## 2019-07-11 NOTE — ED Triage Notes (Signed)
Pt c/o vaginal bleeding x today-states she is pregnant with no prenatal care and estimates ~7-8 mos pregnant-to triage in w/c then to #14

## 2019-07-11 NOTE — ED Provider Notes (Signed)
[facility] EMERGENCY DEPARTMENT Provider Note   CSN: [id number] Arrival date & time: 07/11/19  1614     History   Chief Complaint Chief Complaint  Patient presents with   Pregnant   Vaginal Bleeding    HPI Joan Tran is a 33 y.o. female.     The history is provided by the patient and medical records. No language interpreter was used.  Abdominal Pain Pain location:  Generalized Pain quality: cramping and pressure   Pain radiates to:  Does not radiate Pain severity:  Severe Onset quality:  Gradual Duration:  2 days Timing:  Intermittent Progression:  Worsening Chronicity:  New Context comment:  Pregnant Relieved by:  Nothing Ineffective treatments:  None tried Associated symptoms: vaginal bleeding   Associated symptoms: no chest pain, no chills, no fatigue and no shortness of breath   Risk factors: pregnancy     Past Medical History:  Diagnosis Date   Medical history non-contributory    PONV (postoperative nausea and vomiting)     Patient Active Problem List   Diagnosis Date Noted   S/P cesarean section 04/15/2015   Pregnancy 08/29/2014    Past Surgical History:  Procedure Laterality Date   CESAREAN SECTION     CESAREAN SECTION N/A 04/15/2015   Procedure: CESAREAN SECTION;  Surgeon: Lavina Hamman, MD;  Location: [facility];  Service: Obstetrics;  Laterality: N/A;  Tracie S. RNFA confirmed 03/22/15      OB History    Gravida  4   Para  2   Term  2   Preterm      AB  1   Living  1     SAB      TAB  1   Ectopic      Multiple  0   Live Births  1            Home Medications    Prior to Admission medications   Medication Sig Start Date End Date Taking? Authorizing Provider  HYDROmorphone (DILAUDID) 2 MG tablet Take 1 tablet (2 mg total) by mouth every 3 (three) hours as needed for severe pain. 04/17/15   Meisinger, Tawanna Cooler, MD  ibuprofen (ADVIL,MOTRIN) 600 MG tablet Take 1 tablet (600 mg total) by mouth every 6  (six) hours. 04/17/15   Meisinger, Tawanna Cooler, MD  metoCLOPramide (REGLAN) 10 MG tablet Take 1 tablet (10 mg total) by mouth every 6 (six) hours. Patient not taking: Reported on 04/01/2015 08/29/14   Delbert Phenix, NP  Prenatal Vit-Fe Fumarate-FA (PRENATAL MULTIVITAMIN) TABS tablet Take 2 tablets by mouth daily at 12 noon.    [provider]    Family History History reviewed. No pertinent family history.  Social History Social History   Tobacco Use   Smoking status: Former Smoker    Quit date: 09/14/2014    Years since quitting: 4.8   Smokeless tobacco: Never Used  Substance Use Topics   Alcohol use: No   Drug use: Yes    Types: Heroin    Comment: stopped in 2012      Allergies   Patient has no known allergies.   Review of Systems Review of Systems  Unable to perform ROS: Acuity of condition  Constitutional: Negative for chills and fatigue.  Respiratory: Negative for shortness of breath.   Cardiovascular: Negative for chest pain.  Gastrointestinal: Positive for abdominal distention and abdominal pain.  Genitourinary: Positive for vaginal bleeding and vaginal pain. Negative for flank pain.  Musculoskeletal: Positive for  back pain.  Psychiatric/Behavioral: Positive for agitation.     Physical Exam Updated Vital Signs BP (!) 129/91 (BP Location: Right Arm)    Pulse 80    Temp 98.3 F (36.8 C) (Oral)    Resp 20    Ht  (1.575 m)    SpO2 100%    BMI 37.93 kg/m   Physical Exam Vitals signs and nursing note reviewed. Exam conducted with a chaperone present.  Constitutional:      General: She is not in acute distress.    Appearance: She is well-developed. She is not ill-appearing, toxic-appearing or diaphoretic.  HENT:     Head: Normocephalic and atraumatic.     Right Ear: External ear normal.     Left Ear: External ear normal.     Nose: Nose normal.     Mouth/Throat:     Pharynx: No oropharyngeal exudate.  Eyes:     Conjunctiva/sclera: Conjunctivae  normal.     Pupils: Pupils are equal, round, and reactive to light.  Neck:     Musculoskeletal: Normal range of motion and neck supple.  Cardiovascular:     Rate and Rhythm: Normal rate.     Pulses: Normal pulses.     Heart sounds: No murmur.  Pulmonary:     Effort: Pulmonary effort is normal. No respiratory distress.     Breath sounds: No stridor. No wheezing, rhonchi or rales.  Chest:     Chest wall: No tenderness.  Abdominal:     General: There is distension.     Tenderness: There is generalized abdominal tenderness. There is no right CVA tenderness, guarding or rebound.     Comments: Patient gravid with abdominal tenderness.  Genitourinary:    Vagina: Bleeding present.     Cervix: No cervical motion tenderness.     Comments: Patient cervix was closed but I could palpate the fetal head.  Blood present on glove. Musculoskeletal:        General: No tenderness.  Skin:    General: Skin is warm.     Capillary Refill: Capillary refill takes less than 2 seconds.     Findings: No erythema or rash.  Neurological:     Mental Status: She is alert and oriented to person, place, and time.     Motor: No abnormal muscle tone.     Deep Tendon Reflexes: Reflexes are normal and symmetric.  Psychiatric:        Mood and Affect: Mood is anxious.      ED Treatments / Results  Labs (all labs ordered are listed, but only abnormal results are displayed) Labs Reviewed  RAPID URINE DRUG SCREEN, HOSP PERFORMED - Abnormal; Notable for the following components:      Result Value   Opiates POSITIVE (*)    Cocaine POSITIVE (*)    Benzodiazepines POSITIVE (*)    All other components within normal limits  HEPATITIS C ANTIBODY - Abnormal; Notable for the following components:   HCV Ab Reactive (*)    All other components within normal limits  URINALYSIS, ROUTINE W REFLEX MICROSCOPIC - Abnormal; Notable for the following components:   Color, Urine STRAW (*)    Glucose, UA 50 (*)    Hgb urine  dipstick SMALL (*)    Bacteria, UA RARE (*)    All other components within normal limits  CBC - Abnormal; Notable for the following components:   WBC 23.7 (*)    RBC 3.49 (*)    Hemoglobin 10.1 (*)  HCT 30.2 (*)    All other components within normal limits  SARS CORONAVIRUS 2 BY RT PCR (HOSPITAL ORDER, PERFORMED IN [facility])  HIV ANTIBODY (ROUTINE TESTING W REFLEX)  HEPATITIS B SURFACE ANTIGEN  CBC WITH DIFFERENTIAL/PLATELET  COMPREHENSIVE METABOLIC PANEL  RPR  RUBELLA SCREEN  CBC  TYPE AND SCREEN  ABO/RH  SURGICAL PATHOLOGY    EKG None  Radiology [place] Mfm Ob Limited  Result Date: 07/11/2019 ----------------------------------------------------------------------  OBSTETRICS REPORT                       (Signed Final 07/11/2019 08:20 pm) ---------------------------------------------------------------------- Patient Info  ID #:       [id number]                          D.O.B.:  1985/12/27 (33 yrs)  Name:       Joan Tran              Visit Date: 07/11/2019 06:51 pm ---------------------------------------------------------------------- Performed By  Performed By:     Sandi Mealy        Referred By:       MAU Nursing-                    RDMS                                      MAU/Triage  Attending:        Noralee Space MD        [facility] ---------------------------------------------------------------------- Orders   #  Description                          Code         Ordered By   1  [place] MFM OB LIMITED                    65784.69     Clayton Bibles  ----------------------------------------------------------------------   #  Order #                    Accession #                 Episode #   1  [id number]                  [id number]                  [id number]   ---------------------------------------------------------------------- Indications   [redacted] weeks gestation of pregnancy  Z3A.32  ---------------------------------------------------------------------- Fetal Evaluation  Num Of Fetuses:          1  Fetal Heart Rate(bpm):   146  Cardiac Activity:        Observed  Presentation:            Cephalic  Placenta:                Anterior  P. Cord Insertion:       Not well visualized  Amniotic Fluid  AFI FV:      Within normal limits  AFI Sum(cm)     %Tile       Largest Pocket(cm)  11.05           25          3.89  RUQ(cm)       RLQ(cm)       LUQ(cm)        LLQ(cm)  2.25          1.64          3.27           3.89 ---------------------------------------------------------------------- Biometry  BPD:      83.7  mm     G. Age:  33w 5d         75  %                                                          FL/BPD:      71.9  %    71 - 87  FL:       60.2  mm     G. Age:  31w 2d         11  % ---------------------------------------------------------------------- OB History  Gravidity:    4         Term:   2  TOP:          1        Living:  1 ---------------------------------------------------------------------- Gestational Age  U/S Today:     32w 4d                                        EDD:   09/01/19  Best:          32w 4d     Det. By:  U/S (07/11/19)           EDD:   09/01/19 ---------------------------------------------------------------------- Impression  Patient in active labor was evaluated in the MAU.  A limited ultrasound study was performed. Amniotic fluid is  normal and good fetal activity is seen.  Cephalic presentation.  BPD measurement corresponds to approximately [redacted] weeks  gestation. ----------------------------------------------------------------------                  Noralee Space, MD Electronically Signed Final Report   07/11/2019 08:20 pm ----------------------------------------------------------------------   Procedures Procedures (including critical  care time)  CRITICAL CARE Performed by: Canary Brim Kobyn Kray Total critical care time: 45 minutes Critical care time was exclusive of separately billable procedures and treating other patients. Critical care was necessary to treat or prevent imminent or life-threatening deterioration. Critical care was time spent personally by me on the following activities: development of treatment plan with  patient and/or surrogate as well as nursing, discussions with consultants, evaluation of patient's response to treatment, examination of patient, obtaining history from patient or surrogate, ordering and performing treatments and interventions, ordering and review of laboratory studies, ordering and review of radiographic studies, pulse oximetry and re-evaluation of patient's condition.  EMERGENCY DEPARTMENT [place] PREGNANCY "Study: Limited Ultrasound of the Pelvis for Pregnancy"  INDICATIONS:Pregnancy(required) and Vaginal bleeding Multiple views of the uterus and pelvic cavity were obtained in real-time with a multi-frequency probe.  APPROACH:Transabdominal  PERFORMED BY: Myself IMAGES ARCHIVED?: Yes LIMITATIONS: Emergent procedure FETAL HEART RATE: 130 INTERPRETATION: Fetal heart activity seen      Medications Ordered in ED Medications  sodium chloride 0.9 % bolus 1,000 mL ( Intravenous MAR Unhold 07/11/19 2218)  lactated ringers infusion ( Intravenous Anesthesia Volume Adjustment 07/11/19 2100)  sodium citrate-citric acid (ORACIT) 500-334 MG/5ML solution (has no administration in time range)  scopolamine (TRANSDERM-SCOP) 1 MG/3DAYS 1.5 mg (has no administration in time range)  nalbuphine (NUBAIN) injection 5 mg (has no administration in time range)    Or  nalbuphine (NUBAIN) injection 5 mg (has no administration in time range)  nalbuphine (NUBAIN) injection 5 mg (has no administration in time range)    Or  nalbuphine (NUBAIN) injection 5 mg (has no administration in time range)    acetaminophen (TYLENOL) tablet 1,000 mg (1,000 mg Oral Not Given 07/11/19 2245)  ketorolac (TORADOL) 30 MG/ML injection 30 mg (30 mg Intravenous Given 07/11/19 2209)    Or  ketorolac (TORADOL) 30 MG/ML injection 30 mg ( Intramuscular See Alternative 07/11/19 2209)  oxytocin (PITOCIN) IV infusion 40 units in NS 1000 mL - Premix (2.5 Units/hr Intravenous Transfusing/Transfer 07/11/19 2234)  lactated ringers infusion (has no administration in time range)  zolpidem (AMBIEN) tablet 5 mg (has no administration in time range)  senna-docusate (Senokot-S) tablet 2 tablet (has no administration in time range)  simethicone (MYLICON) chewable tablet 80 mg (has no administration in time range)  simethicone (MYLICON) chewable tablet 80 mg (has no administration in time range)  simethicone (MYLICON) chewable tablet 80 mg (has no administration in time range)  prenatal multivitamin tablet 1 tablet (has no administration in time range)  menthol-cetylpyridinium (CEPACOL) lozenge 3 mg (has no administration in time range)  witch hazel-glycerin (TUCKS) pad 1 application (has no administration in time range)    And  dibucaine (NUPERCAINAL) 1 % rectal ointment 1 application (has no administration in time range)  coconut oil (has no administration in time range)  Tdap (BOOSTRIX) injection 0.5 mL (has no administration in time range)  enoxaparin (LOVENOX) injection 40 mg (has no administration in time range)  ketorolac (TORADOL) 30 MG/ML injection 30 mg (has no administration in time range)    Followed by  ibuprofen (ADVIL) tablet 800 mg (has no administration in time range)  naloxone (NARCAN) injection 0.4 mg (has no administration in time range)    And  sodium chloride flush (NS) 0.9 % injection 9 mL (has no administration in time range)  ondansetron (ZOFRAN) injection 4 mg (has no administration in time range)  diphenhydrAMINE (BENADRYL) injection 12.5 mg (has no administration in time range)    Or   diphenhydrAMINE (BENADRYL) 12.5 MG/5ML elixir 12.5 mg (has no administration in time range)  HYDROmorphone (DILAUDID) 1 mg/mL PCA injection (has no administration in time range)  betamethasone acetate-betamethasone sodium phosphate (CELESTONE) injection 12 mg (12 mg Intramuscular Given 07/11/19 1832)  ceFAZolin (ANCEF) IVPB 2g/100 mL premix ( Intravenous MAR Unhold 07/11/19 2218)  terbutaline (  BRETHINE) injection 0.25 mg (0.25 mg Subcutaneous Given 07/11/19 1847)  sodium citrate-citric acid (ORACIT) solution 30 mL (30 mLs Oral Given 07/11/19 1857)  fentaNYL (SUBLIMAZE) injection 100 mcg (100 mcg Intravenous Given 07/11/19 1922)  fentaNYL (SUBLIMAZE) 250 MCG/5ML injection (has no administration in time range)  fentaNYL (SUBLIMAZE) 250 MCG/5ML injection (has no administration in time range)  midazolam (VERSED) 2 MG/2ML injection (has no administration in time range)  HYDROmorphone (DILAUDID) 1 MG/ML injection (has no administration in time range)  ketamine HCl 50 MG/5ML SOSY (has no administration in time range)  HYDROmorphone (DILAUDID) 1 MG/ML injection (has no administration in time range)  fentaNYL (SUBLIMAZE) 250 MCG/5ML injection (has no administration in time range)  acetaminophen (OFIRMEV) 10 MG/ML IV (has no administration in time range)  ketorolac (TORADOL) 30 MG/ML injection (has no administration in time range)     Initial Impression / Assessment and Plan / ED Course  I have reviewed the triage vital signs and the nursing notes.  Pertinent labs & imaging results that were available during my care of the patient were reviewed by me and considered in my medical decision making (see chart for details).        DORIS GRUHN is a 33 y.o. female with a past medical history significant of 2 previous C-sections who presents with vaginal bleeding and contractions.  Patient reports that she has had no prenatal care and is unsure how far along she is with her pregnancy.  She thinks  it is probably "7 or 8 months".  She thinks she may have gotten pregnant in February but is not sure.  She reports that she did not seek any prenatal care at this time due to transportation problems.  She says that for the last 48 hours she is been having frequent contractions every several minutes as well as significant, 10 of 10 lower abdominal pain.  She reports early this morning, several hours ago, she started having vaginal bleeding.  She reports the pain is intensified.  She has not had any previous vaginal deliveries.  She denies any other preceding symptoms.  She denies trauma.  She has not had any previous ultrasounds for this pregnancy.  Patient quickly taken to resuscitation room and was examined.  Patient's vital signs were reassuring as she is not hypotensive or tachycardic.  Patient clearly in discomfort for abdominal pain.  Bedside ultrasound was utilized and I found a single fetus with a heart rate of 130.  Head was down.  Could not get a good head circumference or size, still unclear how far along patient is.  Pelvic exam was performed with chaperone to check the cervix.  I felt that I could palpate the fetal head but it was far away from the opening of the vagina.  I also felt that the cervical os was closed.  There was some mild vaginal bleeding present.  Decision was made to quickly get labs on the patient, start fluids, both the patient up to OB monitoring, and call the OB/GYN team for recommendations.      5:01 PM Joe spoke with Dr. Rosana Hoes with the OB/GYN team.  Dr. Rosana Hoes recommends patient be transported emergently to the MAU at [facility] for their evaluation.  They suspect she will need a C-section.  They are also concerned about the abdominal pain and bleeding.  They recommended fluids and collecting some labs if this can be done before she needs transport.  Spoke with the rapid response monitoring team who felt  that the patient was having contractions but the fetal heart tones  were appropriate when they were watching it.  They agreed with transfer to MAU.  Patient will be transported via EMS for expedient transfer.   Final Clinical Impressions(s) / ED Diagnoses   Final diagnoses:  Vaginal bleeding  Preterm uterine contractions  Abdominal pain during pregnancy, antepartum    Clinical Impression: 1. Vaginal bleeding   2. Preterm uterine contractions   3. Abdominal pain during pregnancy, antepartum     Disposition: Transfer to MAU for emergent OB/GYN evaluation of contractions, abdominal pain, and vaginal bleeding in pregnant patient.  This note was prepared with assistance of Conservation officer, historic buildings. Occasional wrong-word or sound-a-like substitutions may have occurred due to the inherent limitations of voice recognition software.      Graham Hyun, Canary Brim, MD 07/12/19 [phone]

## 2019-07-11 NOTE — Op Note (Signed)
Brief Op Note  Patient Joan Tran Procedure: RLTCS  I was called in to assist over urgent page curing section. At time of my entry to OR, attempts were being made by primary to deliver fetal occiput, rest of body had been delivered (laboring presumed 32wk IUP, no PNC, substance abuse history). While primary kept pressure via MSV maneuver, I assisted with gentle caudal traction on posteiror lip of hysterotomy. Delivery of fetal head then occur. Limp, no attempt to cry, therefore cord was clamped x2 and cut quickly so infant could be passed off to waiting NICU. Cord gases were obtained  I then stayed to assist in closure of hysterotomy. During this time, infant was noted to have spontaneous cry and good color. Please see rest of official operative note for full details

## 2019-07-11 NOTE — ED Triage Notes (Signed)
Pt  Began  Bleeding approx 3am today moderate amount of bright red blood, no prenatal care unknown how far along she is. 2 prior csections. Believes she is having contractions. A&O

## 2019-07-11 NOTE — ED Notes (Signed)
4 IV insertion attempts by 3 different RNs, unsuccessful.

## 2019-07-11 NOTE — Progress Notes (Signed)
This note also relates to the following rows which could not be included: Pulse Rate - Cannot attach notes to unvalidated device data SpO2 - Cannot attach notes to unvalidated device data  EFM off for transport

## 2019-07-11 NOTE — Progress Notes (Signed)
Late Entry:  1650 Received call from Joan Tran about this 33 yo G4P1 @ unknown GA in with reports of abdominal pain and vaginal bleeding.  No prenatal care. Patient with obviously gravid abdomen per ED RN and appears to be far along in gestation.  SVE per EDP is closed.  Pt is reported to be moving all over the bed due to pain in abdomen.  Pt reports she might be due sometime after Valentines Day but doesn't know for sure. Pt has history of previous C/S X 2.  Dr. Rosana Hoes of FP has been consulted directly by EDP and is accepting immediate transfer of patient to MAU for further evaluation and management.

## 2019-07-12 ENCOUNTER — Encounter (HOSPITAL_COMMUNITY): Payer: Self-pay | Admitting: Family Medicine

## 2019-07-12 LAB — RPR: RPR Ser Ql: NONREACTIVE

## 2019-07-12 MED ORDER — OXYCODONE HCL 5 MG PO TABS: 15.0000 mg | ORAL_TABLET | ORAL | Status: DC | PRN

## 2019-07-12 MED ORDER — OXYCODONE HCL 5 MG PO TABS: 10.0000 mg | ORAL_TABLET | ORAL | Status: DC | PRN

## 2019-07-12 MED ORDER — OXYCODONE HCL 5 MG PO TABS
10.0000 mg | ORAL_TABLET | ORAL | Status: DC | PRN
  Administered 2019-07-12 – 2019-07-13 (×4): 10 mg via ORAL
  Filled 2019-07-12 (×4): qty 2

## 2019-07-12 MED ORDER — OXYCODONE HCL 5 MG PO TABS: 5.0000 mg | ORAL_TABLET | ORAL | Status: DC | PRN

## 2019-07-12 NOTE — Progress Notes (Addendum)
Faculty Attending Note  Post Op Day 1  Subjective: Patient is feeling very sore. She reports moderately well controlled pain on IV pain meds, requesting to not transition to PO yet. She is not ambulating and denies light-headedness or dizziness. She has foley catheter in place. She is passing flatus, has not had a BM yet. She is tolerating a regular diet without nausea/vomiting. Bleeding is moderate. She is bottle feeding. Baby is in NICU and doing well.  Objective: Blood pressure (!) 103/56, pulse (!) 55, temperature 98.2 F (36.8 C), temperature source Oral, resp. rate 17, height 5\' 2"  (1.575 m), weight 68 kg, SpO2 100 %, unknown if currently breastfeeding. Temp:  [97.6 F (36.4 C)-98.5 F (36.9 C)] 98.2 F (36.8 C) (11/25 1140) Pulse Rate:  [55-101] 55 (11/25 1140) Resp:  [15-23] 17 (11/25 1140) BP: (103-134)/(56-95) 103/56 (11/25 1140) SpO2:  [94 %-100 %] 100 % (11/25 1140) Weight:  [68 kg] 68 kg (11/24 1830)  Physical Exam:  General: alert, oriented, cooperative Chest: normal respiratory effort Heart: RRR  Abdomen: soft, appropriately tender to palpation, incision covered by dressing with no evidence of active bleeding  Uterine Fundus: firm, 2 fingers below the umbilicus Lochia: moderate, rubra DVT Evaluation: no evidence of DVT Extremities: no edema, no calf tenderness  UOP: adequate amount clear yellow urine  Recent Labs    07/11/19 2201  HGB 10.1*  HCT 30.2*    Assessment/Plan: Patient Active Problem List   Diagnosis Date Noted  . Status post cesarean section 07/11/2019  . S/P cesarean section 04/15/2015  . Pregnancy 08/29/2014    Patient is 33 y.o. [id number] POD#1 s/p RCS at approx [redacted] weeks gestation for labor in the setting of two prior CS. Pregnancy complicated by no prenatal care. H/o opiate abuse, doing well on PCA. Reviewed will transition to PO meds later today, encouraged ambulation. Overall, she is doing well, recovering appropriately.   SW consult (no  prenatal care and UDS positive for opiates, benzos and cocaine) Continue routine post partum care Pain meds prn Regular diet Lovenox 40 mg daily Lantana undecided for birth control, potentially depo or OCPs Plan for discharge tomorrow or Friday Outpatient circ    Feliz Beam, M.D. Attending Center for [employer] (Faculty Practice)  07/12/2019, 12:23 PM

## 2019-07-12 NOTE — Progress Notes (Signed)
Pt refused labs this morning.  

## 2019-07-12 NOTE — Progress Notes (Signed)
Patient refusing to get up this AM. Patient states "I can get up but I do not want to". Nursing staff educated patient on the benefits of ambulation and patient continues to refuse.

## 2019-07-12 NOTE — Clinical Social Work Maternal (Signed)
CLINICAL SOCIAL WORK MATERNAL/CHILD NOTE  Patient Details  Name: Joan Tran MRN: [id number] Date of Birth: 05/29/1986  Date:  12-17-2018  Clinical Social Worker Initiating Note:  Joan Tran Date/Time: Initiated:  07/12/19/1100     Child's Name:  MOB has not decided on a name.   Biological Parents:  Mother, Father(FOB is Joan Tran 03/21/80)   Need for Interpreter:  None   Reason for Referral:  Current Substance Use/Substance Use During Pregnancy , Late or No Prenatal Care    Address:  [place] [address]    Phone number:  [phone] (home)     Additional phone number:   Household Members/Support Persons (HM/SP):   Household Member/Support Person 1, Household Member/Support Person 2   HM/SP Name Relationship DOB or Age  HM/SP -1 Joan Tran daughter 10/03/2008  HM/SP -2 Joan Tran son 04/13/2015  HM/SP -3        HM/SP -4        HM/SP -5        HM/SP -6        HM/SP -7        HM/SP -8          Natural Supports (not living in the home):  Immediate Family, Friends, Artist Supports: None   Employment: Unemployed   Type of Work:     Education:  [employer] school graduate   Homebound arranged:    Museum/gallery curator Resources:  Medicaid   Other Resources:      Cultural/Religious Considerations Which May Impact Care:  Per [employer], MOB is Engineer, manufacturing.  Strengths:  Other (Comment)(MOB reports that she has the support of her mother and Best Gala Murdoch)   Psychotropic Medications:         Pediatrician:       Pediatrician List:   [facility]      Pediatrician Fax Number:    Risk Factors/Current Problems:      Cognitive State:  Alert , Able to Concentrate , Linear Thinking , Insightful    Mood/Affect:  Interested , Calm , Comfortable , Flat , Relaxed    CSW Assessment: CSW met with MOB in room 110 to  complete an assessment for a consult for [facility] and substance use during pregnancy.  MOB was inviting, polite, and engaging.  When CSW arrived, MOB was resting in bed. MOB appeared forthcoming and was receptive to meeting with CSW.    CSW inquired about NPNC and MOB stated. "This year has been hell; I have no transportation, no insurance and the Covid stuff has just made it hard on everyone." CSW assessed MOB for barriers or concerns with MOB visiting infant in NICU and attending follow-up appointments and MOB reported her plans are to remain in the hospital with infant and room in until infant is medically ready for discharge.  MOB also communicated that FOB will assist MOB obtaining transportation for appointments after infant discharges.  CSW asked about MOB applying for Medicaid Transportation and MOB did not express any interest. CSW encouraged MOB to communicate with CSW if other transportation barriers arise; MOB agreed. MOB communicated that MOB feels prepared to care for infant and MOB has the support of FOB and MOB's mother. CSW inquired about a car seat and a safe place for the infant to sleep.  MOB stated that MOB  has not purchased a car seat or bed for infant and may need assistance with obtaining one.  CSW made MOB aware that the price of the hospital car seat will be $30 and MOB expressed feeling confident paying the stated price.  MOB agreed to contact CSW when MOB is ready to make the car seat purchase. CSW will also reach out to FSN to assist MOB with getting a bed for infant closer time to discharge.  CSW inquired about MOB's SA hx and MOB acknowledged the use of heroin throughout her pregnancy.  MOB reported MOB last snorted heroin 3 days ago. MOB expressed, "I don't shoot up heroin anymore I just snort it.  I haven't used a needle since January;"  MOB proceeded to show CSW her track scares; they did not appear to be recent. CSW made MOB aware that MOB's UDS was positive for Benzos, opiates,  and cocaine.  MOB appeared to be surprised and stated, "I don't use cocaine or  Benzos.  It must be an ingredient in the heroin. I don't have nothing to lie about I'm being truthful.  I use heroin and that's it."    MOB denied the use of any other illicit substance.  CSW informed MOB that the infant's UDS was positive for cocaine and CSW will make a report to [place]. MOB is also aware that  CSW will continue to monitor infant's CDS and will report results to MOB's CPS worker. CSW explained hospital's substance exposure policy and MOB understanding.  CSW also shared CPS process and allow MOB to ask questions.  MOB denied CPS hx but reported that MOB's 2 older children are currently with their grandmothers' (son is with PGM and daughter is with MGM) but MOB has custody. CSW offered MOB resources for SA outpatient counseling and MOB declined.   CSW educated MOB about PPD and informed MOB of possible supports and interventions to decrease PPD. MOB denied PPD with MOB's older 2 children. CSW also encouraged MOB to seek medical attention if needed for increased signs and symptoms for PPD. CSW reviewed safe sleep and SIDS. MOB was knowledgeable.    MOB did not have any questions or concerns at this time, and CSW thanked MOB for allowing CSW to meet with her. CSW will continue to provide support and assess family for their needs while infant remains in NICU.  CSW made a [facility] CPS report to intake worker Joan Tran.  CPS reported that case with be assigned to [place] worker Joan Tran.    CPS worker visited with MOB and infant; a safety plan was initiated. Per CPS at this time there are barriers to infant discharging with MOB when he is medically ready.  However, MOB can visit with infant unsupervised as often as she likes. CPS will keep CSW updated regarding infant's discharge plan.   There are barriers to infant discharging to MOB.   CSW Plan/Description:  Psychosocial Support  and Ongoing Assessment of Needs, Perinatal Mood and Anxiety Disorder (PMADs) Education, Other Patient/Family Education, [place], Other Information/Referral to [employer], Child Copy Report , CSW Awaiting CPS Disposition Plan, CSW Will Continue to Monitor Umbilical Cord Tissue Drug Screen Results and Make Report if Warranted   Laurey Arrow, MSW, LCSW Clinical Social Work [phone]  Dimple Nanas, LCSW 07/12/2019, 3:38 PM

## 2019-07-12 NOTE — Anesthesia Postprocedure Evaluation (Signed)
Anesthesia Post Note  Patient: Joan Tran  Procedure(s) Performed: CESAREAN SECTION (N/A )     Patient location during evaluation: PACU Anesthesia Type: General Level of consciousness: awake and alert Pain management: pain level controlled Vital Signs Assessment: post-procedure vital signs reviewed and stable Respiratory status: spontaneous breathing, nonlabored ventilation, respiratory function stable and patient connected to nasal cannula oxygen Cardiovascular status: stable and blood pressure returned to baseline Postop Assessment: no apparent nausea or vomiting Anesthetic complications: no    Last Vitals:  Vitals:   07/12/19 0201 07/12/19 0205  BP: (!) 115/57   Pulse: 73   Resp:    Temp:    SpO2:  100%    Last Pain:  Vitals:   07/12/19 0040  TempSrc:   PainSc: 5    Pain Goal:                   Joan Tran Joan Tran

## 2019-07-12 NOTE — Lactation Note (Signed)
This note was copied from a baby's chart. Lactation Consultation Note  Patient Name: Joan Tran AYTKZ'S Date: 07/12/2019   Spoke to [place], mom has tested (+) for cocaine and L5, which is contraindicated for BF. Baby is currently in NICU and mom didn't have a feeding choice on admission, but she won't be BF; there are barriers to discharge baby to MOB. [place] services no longer needed.  Maternal Data    Feeding Feeding Type: Donor Breast Milk  LATCH Score                   Interventions    Lactation Tools Discussed/Used     Consult Status      Chick Cousins Francene Boyers 07/12/2019, 10:17 PM

## 2019-07-13 DIAGNOSIS — Z9189 Other specified personal risk factors, not elsewhere classified: Secondary | ICD-10-CM

## 2019-07-13 MED ORDER — NORETHINDRONE 0.35 MG PO TABS: 1.0000 | ORAL_TABLET | Freq: Every day | ORAL | 11 refills | Status: DC

## 2019-07-13 MED ORDER — ACETAMINOPHEN 500 MG PO TABS: 500.0000 mg | ORAL_TABLET | Freq: Four times a day (QID) | ORAL | 0 refills | Status: DC | PRN

## 2019-07-13 MED ORDER — HYDROMORPHONE HCL 2 MG PO TABS: 2.0000 mg | ORAL_TABLET | Freq: Four times a day (QID) | ORAL | 0 refills | Status: DC | PRN

## 2019-07-13 MED ORDER — IBUPROFEN 600 MG PO TABS: 600.0000 mg | ORAL_TABLET | Freq: Three times a day (TID) | ORAL | 0 refills | Status: AC | PRN

## 2019-07-13 MED ORDER — FERROUS SULFATE 325 (65 FE) MG PO TABS: 325.0000 mg | ORAL_TABLET | Freq: Every day | ORAL | 0 refills | Status: DC

## 2019-07-13 NOTE — Discharge Summary (Signed)
Postpartum Discharge Summary     Patient Name: Joan Tran DOB: 1985-12-20 MRN: [id number]  Date of admission: 07/11/2019 Delivering Provider: Truett Mainland   Date of discharge: 07/13/2019  Admitting diagnosis: vaginal bleeding and pregnant Intrauterine pregnancy: Unknown     Secondary diagnosis:  Active Problems:   Status post cesarean section   At risk for abuse of opiates  Additional problems: None     Discharge diagnosis: Preterm Pregnancy Delivered                                                                                                Post partum procedures:None  Augmentation: NA  Complications: None  Hospital course: Joan Tran is a 33 y.o. female G44P2011 with IUP at Unknown gestational age transferred from [place] due to contractions and vaginal bleeding. Pregnancy was been complicated by no prenatal care, history of opiate abuse, 2 prior cesarean sections. Contractions started 2 days ago. Patient with hx of 2 prior c-sections therefore decision made to repeat section. Pregnancy complicated by no prenatal care and hx of opiate abuse. UDS positive for opiates, cocaine and benzos. Postpartum patient was on Dilaudid PCA and then transitioned to PO. Declined oxycodone on discharge and only requested Dilaudid. Few tablets of Dilaudid prescribed. Ferrous sulfate prescribed along with Micronor. Discussed importance of a good birth control plan and need to take pills at same time each day. Vitals stable on discharge. Infant staying due to social concerns and mom will visit. Patient verbalized understanding of discharge and follow-up instructions and was ambulating without assistance upon discharge.  Delivery time: 8:00 PM    Magnesium Sulfate received: No BMZ received: Yes x1 Rhophylac:No MMR:No Transfusion:No  Physical exam  Vitals:   07/12/19 1424 07/12/19 1915 07/12/19 2149 07/13/19 0521  BP:  121/67 100/68 117/74  Pulse:  79 63 60   Resp: (!) _0 Temp:  98.6 F (37 C) (!) 97.5 F (36.4 C) 98.2 F (36.8 C)  TempSrc:  Oral Oral Oral  SpO2: 100% 99% 100%   Weight:      Height:       General: alert, cooperative and no distress Lochia: appropriate Uterine Fundus: firm Incision: Healing well with no significant drainage DVT Evaluation: No evidence of DVT seen on physical exam. Labs: Lab Results  Component Value Date   WBC 23.7 (H) 07/11/2019   HGB 10.1 (L) 07/11/2019   HCT 30.2 (L) 07/11/2019   MCV 86.5 07/11/2019   PLT 297 07/11/2019   No flowsheet data found.  Discharge instruction: per After Visit Summary and "Baby and Me Booklet".  After visit meds:  Allergies as of 07/13/2019   No Known Allergies     Medication List    STOP taking these medications   metoCLOPramide 10 MG tablet Commonly known as: REGLAN     TAKE these medications   acetaminophen 500 MG tablet Commonly known as: TYLENOL Take 1 tablet (500 mg total) by mouth every 6 (six) hours as needed.   ferrous sulfate 325 (65 FE) MG tablet Take 1 tablet (325  mg total) by mouth daily with breakfast.   HYDROmorphone 2 MG tablet Commonly known as: DILAUDID Take 1 tablet (2 mg total) by mouth every 6 (six) hours as needed for severe pain. What changed: when to take this   ibuprofen 600 MG tablet Commonly known as: ADVIL Take 1 tablet (600 mg total) by mouth every 8 (eight) hours as needed. What changed:   when to take this  reasons to take this   norethindrone 0.35 MG tablet Commonly known as: MICRONOR Take 1 tablet (0.35 mg total) by mouth daily.   prenatal multivitamin Tabs tablet Take 2 tablets by mouth daily at 12 noon.       Diet: routine diet  Activity: Advance as tolerated. Pelvic rest for 6 weeks.   Outpatient follow up:4 weeks Follow up Appt:No future appointments. Follow up Visit:  Request made to f/u at [facility] for incision check.  Newborn Data: Live born female  Birth Weight: 5 lb 9.2 oz (2530  g) APGAR: 1, 9  Newborn Delivery   Birth date/time: 07/11/2019 20:00:00 Delivery type: C-Section, Low Transverse Trial of labor: No C-section categorization: Repeat      Baby Feeding: Bottle Disposition:staying due to social work concerns   07/13/2019 Chauncey Mann, MD

## 2019-07-14 LAB — SURGICAL PATHOLOGY

## 2019-07-14 LAB — RUBELLA SCREEN: Rubella: 1.86 index (ref >0.99)

## 2019-07-17 ENCOUNTER — Telehealth: Payer: Self-pay | Admitting: Family Medicine

## 2019-07-17 NOTE — Telephone Encounter (Signed)
Attempted to contact patient w/ incision check appointment. No answer number rang busy after ringing several times. Reminder mailed.

## 2019-07-23 ENCOUNTER — Encounter: Payer: Self-pay | Admitting: *Deleted

## 2019-07-23 NOTE — Patient Instructions (Signed)
[facility]  CSW acknowledges request from MOB's bedside nurse to discuss drug and alcohol rehabilitation programs and services with MOB, as well as MOB's request to pursue treatment.  CSW performed a thorough review of MOB's EMR (Electronic Medical Record).  MOB was not available at the time of CSW's visit or nurse assisted call while in MOB's room.  CSW's contact information was given to MOB's nurse, as well as MOB's boyfriend, encouraging them to have MOB contact CSW as soon as she returns to the room.  If a return call is not received from MOB after 5:00pm today, CSW will handoff this referral to covering CSW to follow-up on Monday, July 24, 2019.  Reason for referral request will also be provided.  Joanna Saporito, BSW, MSW, LCSW  Licensed Clinical Social Worker  Cell # [phone] [email or web address]        

## 2019-07-26 ENCOUNTER — Ambulatory Visit: Payer: Self-pay

## 2019-07-26 NOTE — Progress Notes (Signed)
CSW received a telephone call from CPS reporting that CPS has made several telephone attempts to reach MOB and FOB and have not been successful. CPS request "Family Interaction" updates and CSW made CPS that there have no interaction with the NICU team and parents since 9:45am on 07/25/2019.  CSW also made CPS aware that CSW has made numerous attempts to contact couple via telephone on yesterday (12/8) and today (12/9) and have not been successful.  There are barriers to infant discharging to MOB and FOB at this time.   Laurey Arrow, MSW, LCSW Clinical Social Work [phone]

## 2020-08-17 NOTE — L&D Delivery Note (Signed)
OB/GYN Faculty Practice Delivery Note  Joan Tran is a 35 y.o. [id number] s/p VBAC at [redacted]w[redacted]d. She was admitted for SOL.   ROM: 0h 2m with clear fluid GBS Status: Unknown, no prenatal care  Delivery Date/Time: 07/25/21 at 2105  Patient was initially posted for repeat cesarean section due to hx of cesarean section x3. Patient noted to be complete while awaiting IV access to proceed with surgery. Patient was evaluated by Dr. Shawnie Pons who recommended proceeding with vaginal delivery.   Delivery: Called to room and patient was complete and pushing. Head delivered ROA. No nuchal cord present. Shoulder and body delivered in usual fashion. Infant with spontaneous cry, placed on mother's abdomen, dried and stimulated. Cord clamped x 2 after 1-minute delay and cut by delivering clinician. Cord blood drawn. Placenta delivered spontaneously with gentle cord traction. Fundus firm with massage and Pitocin. TXA given. Labia, perineum, vagina, and cervix were inspected, and patient was found to have a first degree perineal laceration and bilateral labial lacerations. The first degree perineal laceration was repaired with 3-0 Vicryl and found to be hemostatic. Of note, a small 1 cm x 0.5 cm piece of vaginal tissue noted to be protruding from introitus. Patient stated that this was present before delivery. Recommended re-evaluation postpartum. Can consider removal at that time if tissue is bothersome.  Placenta: Intact, 3VC - sent to pathology  Complications: None  Lacerations: 1st degree perineal, bilateral labial  EBL: 100 cc Analgesia: Local 1% lidocaine   Infant: Viable female  APGARs 8 and 8  3090 g  Evalina Field, MD OB/GYN Fellow, Faculty Practice

## 2021-07-25 ENCOUNTER — Encounter (HOSPITAL_COMMUNITY)
Admission: AD | Disposition: A | Discharge status: Home / Self Care | Payer: Self-pay | Source: Home / Self Care | Attending: Family Medicine

## 2021-07-25 ENCOUNTER — Inpatient Hospital Stay (HOSPITAL_COMMUNITY): Payer: Medicaid Other | Admitting: Anesthesiology

## 2021-07-25 ENCOUNTER — Encounter (HOSPITAL_COMMUNITY): Payer: Self-pay | Admitting: General Practice

## 2021-07-25 ENCOUNTER — Other Ambulatory Visit: Payer: Self-pay

## 2021-07-25 ENCOUNTER — Inpatient Hospital Stay (HOSPITAL_COMMUNITY)
Admission: AD | Admit: 2021-07-25 | Discharge: 2021-07-26 | DRG: 806 | Disposition: A | Discharge status: Home / Self Care | Payer: Medicaid Other | Attending: Family Medicine | Admitting: Family Medicine

## 2021-07-25 DIAGNOSIS — O99324 Drug use complicating childbirth: Secondary | ICD-10-CM | POA: Diagnosis present

## 2021-07-25 DIAGNOSIS — F1190 Opioid use, unspecified, uncomplicated: Secondary | ICD-10-CM | POA: Diagnosis present

## 2021-07-25 DIAGNOSIS — Z20822 Contact with and (suspected) exposure to covid-19: Secondary | ICD-10-CM | POA: Diagnosis present

## 2021-07-25 DIAGNOSIS — Z87891 Personal history of nicotine dependence: Secondary | ICD-10-CM | POA: Diagnosis not present

## 2021-07-25 DIAGNOSIS — O34219 Maternal care for unspecified type scar from previous cesarean delivery: Secondary | ICD-10-CM | POA: Diagnosis present

## 2021-07-25 DIAGNOSIS — Z3A36 36 weeks gestation of pregnancy: Secondary | ICD-10-CM

## 2021-07-25 DIAGNOSIS — F1990 Other psychoactive substance use, unspecified, uncomplicated: Secondary | ICD-10-CM | POA: Diagnosis present

## 2021-07-25 DIAGNOSIS — O0930 Supervision of pregnancy with insufficient antenatal care, unspecified trimester: Secondary | ICD-10-CM

## 2021-07-25 DIAGNOSIS — O34211 Maternal care for low transverse scar from previous cesarean delivery: Secondary | ICD-10-CM | POA: Diagnosis not present

## 2021-07-25 DIAGNOSIS — O26893 Other specified pregnancy related conditions, third trimester: Secondary | ICD-10-CM | POA: Diagnosis present

## 2021-07-25 HISTORY — DX: Anxiety disorder, unspecified: F41.9

## 2021-07-25 LAB — HEPATITIS B SURFACE ANTIGEN: Hepatitis B Surface Ag: NONREACTIVE

## 2021-07-25 LAB — RESP PANEL BY RT-PCR (FLU A&B, COVID) ARPGX2
Influenza A by PCR: NEGATIVE
Influenza B by PCR: NEGATIVE
SARS Coronavirus 2 by RT PCR: NEGATIVE

## 2021-07-25 SURGERY — Surgical Case
Anesthesia: Spinal | Wound class: Clean Contaminated

## 2021-07-25 MED ORDER — LACTATED RINGERS IV SOLN: 500.0000 mL | INTRAVENOUS | Status: DC | PRN

## 2021-07-25 MED ORDER — SOD CITRATE-CITRIC ACID 500-334 MG/5ML PO SOLN: 30.0000 mL | ORAL | Status: DC

## 2021-07-25 MED ORDER — SOD CITRATE-CITRIC ACID 500-334 MG/5ML PO SOLN
ORAL | Status: AC
  Administered 2021-07-25: 30 mL
  Filled 2021-07-25: qty 30

## 2021-07-25 MED ORDER — FENTANYL CITRATE (PF) 100 MCG/2ML IJ SOLN
100.0000 ug | Freq: Once | INTRAMUSCULAR | Status: AC
  Administered 2021-07-25: 100 ug via INTRAVENOUS

## 2021-07-25 MED ORDER — BUPIVACAINE HCL (PF) 0.25 % IJ SOLN
INTRAMUSCULAR | Status: AC
  Filled 2021-07-25: qty 40

## 2021-07-25 MED ORDER — FENTANYL CITRATE (PF) 100 MCG/2ML IJ SOLN
INTRAMUSCULAR | Status: AC
  Filled 2021-07-25: qty 2

## 2021-07-25 MED ORDER — ACETAMINOPHEN 325 MG PO TABS: 650.0000 mg | ORAL_TABLET | ORAL | Status: DC | PRN

## 2021-07-25 MED ORDER — LIDOCAINE HCL (PF) 1 % IJ SOLN: 30.0000 mL | INTRAMUSCULAR | Status: DC | PRN

## 2021-07-25 MED ORDER — OXYTOCIN-SODIUM CHLORIDE 30-0.9 UT/500ML-% IV SOLN
2.5000 [IU]/h | INTRAVENOUS | Status: DC
  Administered 2021-07-25: 2.5 [IU]/h via INTRAVENOUS

## 2021-07-25 MED ORDER — OXYCODONE-ACETAMINOPHEN 5-325 MG PO TABS: 1.0000 | ORAL_TABLET | ORAL | Status: DC | PRN

## 2021-07-25 MED ORDER — SOD CITRATE-CITRIC ACID 500-334 MG/5ML PO SOLN: 30.0000 mL | ORAL | Status: DC | PRN

## 2021-07-25 MED ORDER — TRANEXAMIC ACID-NACL 1000-0.7 MG/100ML-% IV SOLN: 1000.0000 mg | Freq: Once | INTRAVENOUS | Status: AC

## 2021-07-25 MED ORDER — SODIUM CHLORIDE 0.9 % IV SOLN: 500.0000 mg | INTRAVENOUS | Status: DC

## 2021-07-25 MED ORDER — LACTATED RINGERS IV SOLN: INTRAVENOUS | Status: DC

## 2021-07-25 MED ORDER — OXYCODONE-ACETAMINOPHEN 5-325 MG PO TABS: 2.0000 | ORAL_TABLET | ORAL | Status: DC | PRN

## 2021-07-25 MED ORDER — ONDANSETRON HCL 4 MG/2ML IJ SOLN: 4.0000 mg | Freq: Four times a day (QID) | INTRAMUSCULAR | Status: DC | PRN

## 2021-07-25 MED ORDER — LIDOCAINE HCL (PF) 1 % IJ SOLN
INTRAMUSCULAR | Status: AC
  Filled 2021-07-25: qty 30

## 2021-07-25 MED ORDER — TRANEXAMIC ACID-NACL 1000-0.7 MG/100ML-% IV SOLN
INTRAVENOUS | Status: AC
  Administered 2021-07-25: 1000 mg
  Filled 2021-07-25: qty 100

## 2021-07-25 MED ORDER — LIDOCAINE HCL (PF) 1 % IJ SOLN: 5.0000 mL | Freq: Once | INTRAMUSCULAR | Status: DC

## 2021-07-25 MED ORDER — CEFAZOLIN SODIUM-DEXTROSE 2-4 GM/100ML-% IV SOLN: 2.0000 g | INTRAVENOUS | Status: DC

## 2021-07-25 MED ORDER — SODIUM CHLORIDE 0.9 % IR SOLN
Status: DC | PRN
  Administered 2021-07-25: 1000 mL

## 2021-07-25 MED ORDER — OXYTOCIN-SODIUM CHLORIDE 30-0.9 UT/500ML-% IV SOLN
INTRAVENOUS | Status: AC
  Filled 2021-07-25: qty 500

## 2021-07-25 MED ORDER — OXYTOCIN BOLUS FROM INFUSION
333.0000 mL | Freq: Once | INTRAVENOUS | Status: AC
  Administered 2021-07-25: 333 mL via INTRAVENOUS

## 2021-07-25 NOTE — OB Triage Provider Note (Signed)
Patient arrived by private car and reports contractions since this morning.  Appears very uncomfortable and unable to sit in wheelchair. States she has had no PNC, but has had 3 previous C/S.  Provider performs BSUS confirming vertex position. VE 9/90/BBOW.  Patient requesting repeat C/S and medication until that time. Nurse instructed to contact labor team.  Patient endorses usage of opioids throughout the pregnancy.  States LMP was in Jan or Feb.   Provider remains at bedside while patient transferred to L&D.  Dr. Gildardo Griffes assumes care of patient.  Cherre Robins MSN, CNM Advanced Practice Provider, Center for [employer]

## 2021-07-25 NOTE — H&P (Signed)
  Joan Tran is an 35 y.o. [phone] Unknown female.   Chief Complaint: labor HPI: no prenatal care and c-section x 3. Arrived in labor, desired RCS.  Past Medical History:  Diagnosis Date   PONV (postoperative nausea and vomiting)     Past Surgical History:  Procedure Laterality Date   CESAREAN SECTION     CESAREAN SECTION N/A 04/15/2015   Procedure: CESAREAN SECTION;  Surgeon: Lavina Hamman, MD;  Location: [facility] ORS;  Service: Obstetrics;  Laterality: N/A;  Tracie S. RNFA confirmed 03/22/15    CESAREAN SECTION N/A 07/11/2019   Procedure: CESAREAN SECTION;  Surgeon: Levie Heritage, DO;  Location: [facility] LD ORS;  Service: Obstetrics;  Laterality: N/A;    No family history on file. Social History:  reports that she quit smoking about 6 years ago. She has never used smokeless tobacco. She reports current drug use. Drug: Heroin. She reports that she does not drink alcohol.   No Known Allergies  Medications Prior to Admission  Medication Sig Dispense Refill   acetaminophen (TYLENOL) 500 MG tablet Take 1 tablet (500 mg total) by mouth every 6 (six) hours as needed. 30 tablet 0   ferrous sulfate 325 (65 FE) MG tablet Take 1 tablet (325 mg total) by mouth daily with breakfast. 90 tablet 0   HYDROmorphone (DILAUDID) 2 MG tablet Take 1 tablet (2 mg total) by mouth every 6 (six) hours as needed for severe pain. 12 tablet 0   ibuprofen (ADVIL) 600 MG tablet Take 1 tablet (600 mg total) by mouth every 8 (eight) hours as needed. 60 tablet 0   norethindrone (MICRONOR) 0.35 MG tablet Take 1 tablet (0.35 mg total) by mouth daily. 1 Package 11   Prenatal Vit-Fe Fumarate-FA (PRENATAL MULTIVITAMIN) TABS tablet Take 2 tablets by mouth daily at 12 noon.       A comprehensive review of systems was negative.  unknown if currently breastfeeding. There were no vitals taken for this visit. General appearance: alert, cooperative, and appears stated age Head: Normocephalic, without obvious abnormality,  atraumatic Neck: supple, symmetrical, trachea midline Lungs:  normal effort Heart: regular rate and rhythm Abdomen: soft, non-tender; bowel sounds normal; no masses,  no organomegaly Extremities: extremities normal, atraumatic, no cyanosis or edema Skin: Skin color, texture, turgor normal. No rashes or lesions Neurologic: Grossly normal Dilation: 9 Effacement (%): 90 Station: 0 Presentation: Vertex Exam by:: Dr. Shawnie Pons    Lab Results  Component Value Date   WBC 23.7 (H) 07/11/2019   HGB 10.1 (L) 07/11/2019   HCT 30.2 (L) 07/11/2019   MCV 86.5 07/11/2019   PLT 297 07/11/2019           ABO, Rh:    Antibody:    Rubella:    RPR:    HBsAg:    HIV:    GBS:       Prenatal Transfer Tool  Maternal Diabetes: No Genetic Screening: Declined Maternal Ultrasounds/Referrals: Other: Fetal Ultrasounds or other Referrals:  None Maternal Substance Abuse:  Yes:  Type: Smoker, Other: Heroin Significant Maternal Medications:  None Significant Maternal Lab Results: None   Assessment/Plan Principal Problem:   Previous cesarean delivery affecting pregnancy, antepartum  For RCS-- Risks include but are not limited to bleeding, infection, injury to surrounding structures, including bowel, bladder and ureters, blood clots, and death.  Likelihood of success is high.  Reva Bores 07/25/2021, 8:45 PM

## 2021-07-25 NOTE — MAU Note (Signed)
Pt very uncomfortable assisted out of car. Crying.Assited to room. Pt stated she has not had Prenatal care Stated her last period was in Jan or feb.  Placed on EFM FHR 120. U/S shows head down.   SVE 9/100 BOW. Pt reports she has had 3 c-sections.  Providers at bedside. Notified Labor team on L&D.  PT transferred to 217.

## 2021-07-25 NOTE — Anesthesia Preprocedure Evaluation (Deleted)
Anesthesia Evaluation  Patient identified by MRN, date of birth, ID band Patient awake    Reviewed: Allergy & Precautions, NPO status , Patient's Chart, lab work & pertinent test results  History of Anesthesia Complications (+) PONV and history of anesthetic complications  Airway Mallampati: III  TM Distance: >3 FB Neck ROM: Full    Dental no notable dental hx. (+) Teeth Intact, Dental Advisory Given   Pulmonary former smoker,    Pulmonary exam normal breath sounds clear to auscultation       Cardiovascular negative cardio ROS Normal cardiovascular exam Rhythm:Regular Rate:Normal     Neuro/Psych negative neurological ROS  negative psych ROS   GI/Hepatic GERD  Medicated,(+)     substance abuse  ,   Endo/Other    Renal/GU negative Renal ROS  negative genitourinary   Musculoskeletal negative musculoskeletal ROS (+) narcotic dependent  Abdominal   Peds  Hematology  (+) anemia ,   Anesthesia Other Findings   Reproductive/Obstetrics (+) Pregnancy Previous C/Section x 2                             Anesthesia Physical Anesthesia Plan  ASA: 2 and emergent  Anesthesia Plan: Spinal   Post-op Pain Management:    Induction:   PONV Risk Score and Plan: 4 or greater and Treatment may vary due to age or medical condition, Scopolamine patch - Pre-op and Ondansetron  Airway Management Planned: Natural Airway  Additional Equipment:   Intra-op Plan:   Post-operative Plan:   Informed Consent: I have reviewed the patients History and Physical, chart, labs and discussed the procedure including the risks, benefits and alternatives for the proposed anesthesia with the patient or authorized representative who has indicated his/her understanding and acceptance.     Dental advisory given  Plan Discussed with: Anesthesiologist and CRNA  Anesthesia Plan Comments:         Anesthesia  Quick Evaluation

## 2021-07-25 NOTE — Consult Note (Signed)
Delivery Note    Requested by Dr.  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 Rupture of membranes occurred just PTD with clear fluid. Infant vigorous with good spontaneous cry.  Delayed cord clamping performed x 1 minute. At end of minute, poor air exchange noted with blue color due to choking on secretions.  While cord clamped, baby bulb suctioned and then brought to warmer where bulb suctioned more and more warming, drying and stimulation. Improved.  HR >100, good crying, but persistent cyanosis.  While SAo2 placed, BBO2 begun.  Sats in low 80s with gradual improvement in oximetry and color.  Chest PT performed and bulb suctioned more clear secretions.  Able to remove BBO2 after couple minutes.  No WOB or tachypnea.  Apgars 8/8. Physical exam within normal limits except for noted hypospadias.  +Voids in LDR.  EGA suspect ~37-38 weeks.  Left in LDR for skin-to-skin contact with mother, in care of CN staff. Care transferred to Pediatrician.  Need for prenatal labs reinforced with OB team including management of significant in utero drug exposures.   Dineen Kid Leary Roca, MD Neonatologist 07/25/2021, 9:35 PM

## 2021-07-26 DIAGNOSIS — O0930 Supervision of pregnancy with insufficient antenatal care, unspecified trimester: Secondary | ICD-10-CM

## 2021-07-26 DIAGNOSIS — F1990 Other psychoactive substance use, unspecified, uncomplicated: Secondary | ICD-10-CM | POA: Diagnosis present

## 2021-07-26 LAB — RAPID URINE DRUG SCREEN, HOSP PERFORMED
Amphetamines: NOT DETECTED
Barbiturates: NOT DETECTED
Benzodiazepines: NOT DETECTED
Cocaine: POSITIVE — AB
Opiates: NOT DETECTED
Tetrahydrocannabinol: NOT DETECTED

## 2021-07-26 LAB — CBC
HCT: 29.7 % — ABNORMAL LOW (ref 36.0–46.0)
Hemoglobin: 9.7 g/dL — ABNORMAL LOW (ref 12.0–15.0)
MCH: 26.4 pg (ref 26.0–34.0)
MCHC: 32.7 g/dL (ref 30.0–36.0)
MCV: 80.7 fL (ref 80.0–100.0)
Platelets: 410 10*3/uL — ABNORMAL HIGH (ref 150–400)
RBC: 3.68 MIL/uL — ABNORMAL LOW (ref 3.87–5.11)
RDW: 13.9 % (ref 11.5–15.5)
WBC: 24.5 10*3/uL — ABNORMAL HIGH (ref 4.0–10.5)
nRBC: 0 % (ref 0.0–0.2)

## 2021-07-26 LAB — RPR
RPR Ser Ql: NONREACTIVE
RPR Ser Ql: NONREACTIVE

## 2021-07-26 LAB — HEPATITIS C ANTIBODY: HCV Ab: REACTIVE — AB

## 2021-07-26 LAB — TYPE AND SCREEN
ABO/RH(D): A POS
Antibody Screen: NEGATIVE

## 2021-07-26 LAB — HIV ANTIBODY (ROUTINE TESTING W REFLEX): HIV Screen 4th Generation wRfx: NONREACTIVE

## 2021-07-26 MED ORDER — MEDROXYPROGESTERONE ACETATE 150 MG/ML IM SUSP: 150.0000 mg | INTRAMUSCULAR | Status: DC | PRN

## 2021-07-26 MED ORDER — COCONUT OIL OIL: 1.0000 "application " | TOPICAL_OIL | Status: DC | PRN

## 2021-07-26 MED ORDER — WITCH HAZEL-GLYCERIN EX PADS: 1.0000 "application " | MEDICATED_PAD | CUTANEOUS | Status: DC | PRN

## 2021-07-26 MED ORDER — PRENATAL MULTIVITAMIN CH
1.0000 | ORAL_TABLET | Freq: Every day | ORAL | Status: DC
  Administered 2021-07-26: 1 via ORAL
  Filled 2021-07-26: qty 1

## 2021-07-26 MED ORDER — ACETAMINOPHEN 325 MG PO TABS
650.0000 mg | ORAL_TABLET | ORAL | 0 refills | Status: AC | PRN
Start: 2021-07-26 — End: ?

## 2021-07-26 MED ORDER — POLYSACCHARIDE IRON COMPLEX 150 MG PO CAPS
150.0000 mg | ORAL_CAPSULE | Freq: Every day | ORAL | Status: DC
  Administered 2021-07-26: 150 mg via ORAL
  Filled 2021-07-26: qty 1

## 2021-07-26 MED ORDER — NORETHINDRONE 0.35 MG PO TABS
1.0000 | ORAL_TABLET | Freq: Every day | ORAL | 11 refills | Status: AC
Start: 2021-07-26 — End: 2022-07-26

## 2021-07-26 MED ORDER — SENNOSIDES-DOCUSATE SODIUM 8.6-50 MG PO TABS
2.0000 | ORAL_TABLET | Freq: Every day | ORAL | Status: DC
  Administered 2021-07-26: 2 via ORAL
  Filled 2021-07-26: qty 2

## 2021-07-26 MED ORDER — MEASLES, MUMPS & RUBELLA VAC IJ SOLR: 0.5000 mL | Freq: Once | INTRAMUSCULAR | Status: DC

## 2021-07-26 MED ORDER — BENZOCAINE-MENTHOL 20-0.5 % EX AERO
1.0000 "application " | INHALATION_SPRAY | CUTANEOUS | Status: DC | PRN
  Administered 2021-07-26: 1 via TOPICAL
  Filled 2021-07-26: qty 56

## 2021-07-26 MED ORDER — IBUPROFEN 600 MG PO TABS: 600.0000 mg | ORAL_TABLET | Freq: Four times a day (QID) | ORAL | 0 refills | Status: AC

## 2021-07-26 MED ORDER — DIBUCAINE (PERIANAL) 1 % EX OINT: 1.0000 "application " | TOPICAL_OINTMENT | CUTANEOUS | Status: DC | PRN

## 2021-07-26 MED ORDER — IBUPROFEN 600 MG PO TABS
600.0000 mg | ORAL_TABLET | Freq: Four times a day (QID) | ORAL | Status: DC
  Administered 2021-07-26 (×3): 600 mg via ORAL
  Filled 2021-07-26 (×3): qty 1

## 2021-07-26 MED ORDER — SIMETHICONE 80 MG PO CHEW: 80.0000 mg | CHEWABLE_TABLET | ORAL | Status: DC | PRN

## 2021-07-26 MED ORDER — DIPHENHYDRAMINE HCL 25 MG PO CAPS: 25.0000 mg | ORAL_CAPSULE | Freq: Four times a day (QID) | ORAL | Status: DC | PRN

## 2021-07-26 MED ORDER — ACETAMINOPHEN 325 MG PO TABS: 650.0000 mg | ORAL_TABLET | ORAL | Status: DC | PRN

## 2021-07-26 MED ORDER — ONDANSETRON HCL 4 MG PO TABS: 4.0000 mg | ORAL_TABLET | ORAL | Status: DC | PRN

## 2021-07-26 MED ORDER — ONDANSETRON HCL 4 MG/2ML IJ SOLN: 4.0000 mg | INTRAMUSCULAR | Status: DC | PRN

## 2021-07-26 MED ORDER — TETANUS-DIPHTH-ACELL PERTUSSIS 5-2.5-18.5 LF-MCG/0.5 IM SUSY: 0.5000 mL | PREFILLED_SYRINGE | Freq: Once | INTRAMUSCULAR | Status: DC

## 2021-07-26 MED ORDER — POLYSACCHARIDE IRON COMPLEX 150 MG PO CAPS
150.0000 mg | ORAL_CAPSULE | ORAL | 0 refills | Status: AC
Start: 2021-07-26 — End: 2021-08-25

## 2021-07-26 NOTE — Progress Notes (Signed)
Post Partum Day 1 Subjective: up ad lib, voiding, tolerating PO, and + flatus Patient requests discharge this afternoon.  Objective: Blood pressure 118/60, pulse 85, temperature 98.9 F (37.2 C), temperature source Oral, resp. rate 18, height 5\' 2"  (1.575 m), weight 61.2 kg, last menstrual period 11/11/2020, SpO2 100 %, unknown if currently breastfeeding.  Physical Exam:  General: alert, cooperative, appears stated age, and mild distress Lochia: appropriate Uterine Fundus: firm Incision: N/A DVT Evaluation: No evidence of DVT seen on physical exam.  Recent Labs    07/25/21 2356  HGB 9.7*  HCT 29.7*    Assessment/Plan: Discussed with patient that LCSW consult is essential component of being cleared for discharge 14/09/22 RN will page to estimate time of consult today CNM will order discharge PRN   LOS: 1 day   Judeth Cornfield, CNM 07/26/2021, 10:34 AM

## 2021-07-26 NOTE — Progress Notes (Signed)
Patient has been stating that she needs to be discharged today in order to "take care of things for the baby." Patient stating that if she does not have discharge orders by 1400, then she will will leave AMA. Then she stated that she had a ride coming at 1330 so she needed orders. Spoke with social work who states CPS says she can be discharged as long as baby stays. Called Thalia Bloodgood for discharge orders. Earl Gala, Linda Hedges [place]

## 2021-07-26 NOTE — Discharge Summary (Signed)
Postpartum Discharge Summary    Patient Name: Joan Tran DOB: 19-Oct-1985 MRN: [id number]  Date of admission: 07/25/2021 Delivery date:07/25/2021  Delivering provider: Donnamae Jude  Date of discharge: 07/26/2021  Admitting diagnosis: Previous cesarean delivery affecting pregnancy, antepartum [O34.219] Indication for care in labor or delivery [O75.9] Intrauterine pregnancy: [redacted]w[redacted]d    Secondary diagnosis:  Principal Problem:   VBAC (vaginal birth after Cesarean) Active Problems:   Previous cesarean delivery affecting pregnancy, antepartum   Substance use   No prenatal care in current pregnancy  Additional problems: N/A    Discharge diagnosis: Preterm Pregnancy Delivered                                              Post partum procedures: N/A Augmentation:  None Complications: None  Hospital course: Onset of Labor With Vaginal Delivery      35y.o. yo [id number] admitted in Active Labor on 07/25/2021. Patient was initially posted for repeat cesarean section due to hx of CS x3, however, patient progressed to complete before IV access achieved and decision was made to proceed with vaginal delivery. Patient had an uncomplicated vaginal delivery.   Membrane Rupture Time/Date: 9:05 PM ,07/25/2021   Delivery Method:Vaginal, Spontaneous  Episiotomy: None  Lacerations:  1st degree;Labial  Patient had an uncomplicated postpartum course.  She is ambulating, tolerating a regular diet, passing flatus, and urinating well. Patient is discharged home in stable condition on 07/26/21.  Newborn Data: Birth date:07/25/2021  Birth time:9:05 PM  Gender:Female  Living status:Living  Apgars:8 ,8  Weight:3090 g   Magnesium Sulfate received: No BMZ received: No Rhophylac:N/A MMR:N/A T-DaP: Offered postpartum  Flu: No Transfusion: No  Physical exam  Vitals:   07/25/21 2330 07/25/21 2347 07/26/21 0030 07/26/21 0150  BP: 112/64 120/77 134/72 132/70  Pulse: 84 74 91 89  Resp:   _0 Temp:   98.6 F (37 C) 98.3 F (36.8 C)  TempSrc:   Oral Oral  SpO2:   100% 100%  Weight:      Height:       General: alert, cooperative, and no distress Lochia: appropriate Uterine Fundus: firm Incision: N/A DVT Evaluation: No evidence of DVT seen on physical exam.  Labs: Lab Results  Component Value Date   WBC 24.5 (H) 07/25/2021   HGB 9.7 (L) 07/25/2021   HCT 29.7 (L) 07/25/2021   MCV 80.7 07/25/2021   PLT 410 (H) 07/25/2021   No flowsheet data found. Edinburgh Score: Edinburgh Postnatal Depression Scale Screening Tool 07/26/2021  I have been able to laugh and see the funny side of things. 0  I have looked forward with enjoyment to things. 0  I have blamed myself unnecessarily when things went wrong. 2  I have been anxious or worried for no good reason. 2  I have felt scared or panicky for no good reason. 0  Things have been getting on top of me. 2  I have been so unhappy that I have had difficulty sleeping. 1  I have felt sad or miserable. 2  I have been so unhappy that I have been crying. 1  The thought of harming myself has occurred to me. 0  Edinburgh Postnatal Depression Scale Total 10     After visit meds:  Allergies as of 07/26/2021   No Known Allergies  Medication List     STOP taking these medications    ferrous sulfate 325 (65 FE) MG tablet   HYDROmorphone 2 MG tablet Commonly known as: DILAUDID   prenatal multivitamin Tabs tablet       TAKE these medications    acetaminophen 325 MG tablet Commonly known as: Tylenol Take 2 tablets (650 mg total) by mouth every 4 (four) hours as needed for up to 180 doses (for pain scale < 4). What changed:  medication strength how much to take when to take this reasons to take this   ibuprofen 600 MG tablet Commonly known as: ADVIL Take 1 tablet (600 mg total) by mouth every 8 (eight) hours as needed. What changed: Another medication with the same name was added. Make sure you  understand how and when to take each.   ibuprofen 600 MG tablet Commonly known as: ADVIL Take 1 tablet (600 mg total) by mouth every 6 (six) hours. What changed: You were already taking a medication with the same name, and this prescription was added. Make sure you understand how and when to take each.   iron polysaccharides 150 MG capsule Commonly known as: NIFEREX Take 1 capsule (150 mg total) by mouth every other day.   norethindrone 0.35 MG tablet Commonly known as: Ortho Micronor Take 1 tablet (0.35 mg total) by mouth daily.         Discharge home in stable condition Infant Feeding: Bottle Infant Disposition: CPS Discharge instruction: per After Visit Summary and Postpartum booklet. Activity: Advance as tolerated. Pelvic rest for 6 weeks.  Diet: routine diet Future Appointments:No future appointments. Follow up Visit: No prenatal care this pregnancy. Message sent to [facility] by Dr. Gwenlyn Perking on 07/26/21.   Please schedule this patient for a In person postpartum visit in 4 weeks with the following provider: Any provider, Dr. Dione Plover if able. Additional Postpartum F/U: None   High risk pregnancy complicated by:  Hx of CS x3, substance use, no prenatal care Delivery mode:  Vaginal, Spontaneous  Anticipated Birth Control:  Unsure   S/p inpatient social work consult. No barriers to discharge if newborn remains in CPS care  Patient encouraged to consider Depo or Nexplanon vs POP. Declines.  Mallie Snooks, [place], MSN, CNM Certified Nurse Midwife, Product/process development scientist for [employer], [place]

## 2021-07-26 NOTE — Clinical Social Work Maternal (Signed)
CLINICAL SOCIAL WORK MATERNAL/CHILD NOTE  Patient Details  Name: Joan Tran MRN: [id number] Date of Birth: 07/30/1986  Date:  07/26/2021  Clinical Social Worker Initiating Note:  Joan Tran, MSW, LCSWA Date/Time: Initiated:  07/26/21/1232     Child's Name:  undecided   Biological Parents:  Mother, Father (Joan Tran, 03/21/80, [phone])   Need for Interpreter:  None   Reason for Referral:  Current Substance Use/Substance Use During Pregnancy  , Late or No Prenatal Care     Address:  [address] [place] [address]    Phone number:  [phone] (home)     Additional phone number: [phone] (Joan Tran, FOB), [phone] (Joan Tran, Friend)  Household Members/Support Persons (HM/SP):   Household Member/Support Person 1   HM/SP Name Relationship DOB or Age  HM/SP -1 Joan Tran Friend    HM/SP -2        HM/SP -3        HM/SP -4        HM/SP -5        HM/SP -6        HM/SP -7        HM/SP -8          Natural Supports (not living in the home):  Friends   Professional Supports: None   Employment: Unemployed   Type of Work:     Education:  High school graduate   Homebound arranged:    Financial Resources:  Medicaid   Other Resources:  Food Stamps     Cultural/Religious Considerations Which May Impact Care:    Strengths:      Psychotropic Medications:         Pediatrician:       Pediatrician List:   [place]    [place]    [place]    [place]    [place]    [place]      Pediatrician Fax Number:    Risk Factors/Current Problems:  Substance Use     Cognitive State:  Alert  , Able to Concentrate  , Racing Thoughts  , Poor Insight  , Poor Judgement     Mood/Affect:  Interested  , Comfortable  , Relaxed  , Anxious     CSW Assessment: CSW met with MOB to complete consult for no prenatal care, and substance use during pregnancy. CSW observed MOB resting in bed, bonding with  infant. CSW explained role, and reason for consult. MOB was pleasant, and polite during engagement with CSW. MOB reported, history of anxiety, and ADHD. MOB reported, her last dose of Klonopin was seven to eight years ago. MOB reported, she has been able to manage symptoms without medication. MOB reported, she may be open to resuming medication in the future. CSW encourage MOB to implement healthy coping skills when symptoms arises.  MOB reported, she has been living on the road with her best friend Joan Tran (truck driver). MOB reported, she was not aware of pregnancy until she was six months. Therefore, that is the reason for no prenatal care. MOB acknowledges infant will remain in hospital for the next five days. However, MOB stated, she must go back with Joan to [place] today to pick up his car. MOB reported, Joan's car is the only transportation she will have to get around. MOB reported, once infant is medically cleared both will be staying at Joan's house while he's on the road. MOB reported, she is unsure if infant's father   will be involved in care. MOB reported, since delivery she feels, "okay". MOB reported, Joan is very supportive. MOB denied SI, HI, and DV when CSW assessed for safety.   MOB reported, history of heroin, benzos, and cocaine. MOB reported, she has been using heroin on, and off for ten years. MOB reported, her last use of heroin was four days ago. MOB reported, she has been using benzos, and cocaine on, and off for four to five years. MOB reported, she does not use "no more of that" anymore.  MOB denied any additional illicit substances. MOB reported, CPS involvement with her last child Joan Tran 07/11/19, and no longer has rights to child ([place]). MOB reported, her other two children Joan Tran 10/03/08, and Joan Tran 04/13/15 live with her mother Joan Tran [phone] at [address] [place], [place]. MOB reported, she still has rights to other two  children. However, at the time, her mother was more stable.   CSW inform MOB of drug screen policy, and MOB was understanding of protocol. CSW contact [place] to notify them of hospitals concerns. Per Joan Tran, CPS there are no barriers to discharge MOB. However, if MOB attempts to discharge with infant contact them again. CSW will continue to follow the CDS, and will make CPS report if warranted.   CSW provided education regarding the baby blues period vs. perinatal mood disorders, discussed treatment and gave resources for mental health follow up if concerns arise. CSW recommends self- evaluation during the postpartum time period using the New Mom Checklist from Postpartum Progress and encouraged MOB to contact a medical professional if symptoms are noted at any time.   MOB reported, she receives food stamps, but does not receives WIC. CSW encourage MOB to apply for WIC for additional support. MOB reported, she is unsure of infant's pediatrician, but there are no transportation barriers to follow up infant's care. CSW provided MOB with peds list. MOB reported, infant has a bassinet, but does not have a car seat. CSW inform MOB if car seat is not purchase prior to discharge. CSW could provide MOB with a car seat for a fee of $30. MOB denied any additional barriers.     CSW provided education on sudden infant death syndrome (SIDS).  CSW provided perinatal mood disorders resources.   Per Joan Tran, CPS there are no barriers to discharge MOB. However, if MOB attempts to discharge with infant contact them again. CSW will continue to follow the CDS, and will make CPS report if warranted.   CSW Plan/Description:  Sudden Infant Death Syndrome (SIDS) Education, Perinatal Mood and Anxiety Disorder (PMADs) Education, Psychosocial Support and Ongoing Assessment of Needs, CSW Will Continue to Monitor Umbilical Cord Tissue Drug Screen Results and Make Report if Warranted, Hospital Drug Screen Policy  Information, Other Information/Referral to Community Resources, Neonatal Abstinence Syndrome (NAS) Education, Child Protective Service Report     Joan Tran, MSW, LCSW-A Clinical Social Worker- Weekends [phone]   Calen Geister, LCSWA 07/26/2021, 12:53 PM 

## 2021-07-26 NOTE — Discharge Instructions (Signed)

## 2021-07-27 LAB — RUBELLA SCREEN: Rubella: 2.34 index (ref >0.99)

## 2021-07-29 LAB — SURGICAL PATHOLOGY

## 2021-08-07 ENCOUNTER — Telehealth (HOSPITAL_COMMUNITY): Payer: Self-pay

## 2021-08-07 DIAGNOSIS — Z1331 Encounter for screening for depression: Secondary | ICD-10-CM

## 2021-08-07 NOTE — Telephone Encounter (Addendum)
RN called phone number a man answered the phone. RN introduced herself and then phone line disconnected. Attempted to call number again and got no answer, no voicemail.   EPDS score was 10 on 07/26/21. Referral placed for integrated behavioral health.  Joan Tran [facility] 08/07/2021,1619

## 2021-08-25 NOTE — Progress Notes (Deleted)
° ° °  Post Partum Visit Note  Joan Tran is a 36 y.o. [phone] female who presents for a postpartum visit. She is 4 weeks postpartum following a VBAC.  I have fully reviewed the prenatal and intrapartum course. The delivery was at 36.4 gestational weeks.  Anesthesia: none. Postpartum course has been ***. Baby is doing well***. Baby is feeding by {breast/bottle:69}. Bleeding {vag bleed:12292}. Bowel function is {normal:32111}. Bladder function is {normal:32111}. Patient {is/is not:9024} sexually active. Contraception method is {contraceptive method:5051}. Postpartum depression screening: {gen negative/positive:315881}.   The pregnancy intention screening data noted above was reviewed. Potential methods of contraception were discussed. The patient elected to proceed with No data recorded.    Health Maintenance Due  Topic Date Due   COVID-19 Vaccine (1) Never done   PAP SMEAR-Modifier  Never done   INFLUENZA VACCINE  Never done    {Common ambulatory SmartLinks:19316}  Review of Systems {ros; complete:30496}  Objective:  There were no vitals taken for this visit.   General:  {gen appearance:16600}   Breasts:  {desc; normal/abnormal/not indicated:14647}  Lungs: {lung exam:16931}  Heart:  {heart exam:5510}  Abdomen: {abdomen exam:16834}   Wound {Wound assessment:11097}  GU exam:  {desc; normal/abnormal/not indicated:14647}       Assessment:    There are no diagnoses linked to this encounter.  *** postpartum exam.   Plan:   Essential components of care per ACOG recommendations:  1.  Mood and well being: Patient with {gen negative/positive:315881} depression screening today. Reviewed local resources for support.  - Patient tobacco use? {tobacco use:25506}  - hx of drug use? {yes/no:25505}    2. Infant care and feeding:  -Patient currently breastmilk feeding? {yes/no:25502}  -Social determinants of health (SDOH) reviewed in EPIC. No concerns***The following needs were  identified***  3. Sexuality, contraception and birth spacing - Patient {DOES_DOES AVW:09811} want a pregnancy in the next year.  Desired family size is {NUMBER 1-10:22536} children.  - Reviewed forms of contraception in tiered fashion. Patient desired {PLAN CONTRACEPTION:313102} today.   - Discussed birth spacing of 18 months  4. Sleep and fatigue -Encouraged family/partner/community support of 4 hrs of uninterrupted sleep to help with mood and fatigue  5. Physical Recovery  - Discussed patients delivery and complications. She describes her labor as {description:25511} - Patient had a {CHL AMB DELIVERY:[phone]}. Patient had a {laceration:25518} laceration. Perineal healing reviewed. Patient expressed understanding - Patient has urinary incontinence? {yes/no:25515} - Patient {ACTION; IS/IS BJY:[id number]} safe to resume physical and sexual activity  6.  Health Maintenance - HM due items addressed {Yes or If no, why not?:20788} - Last pap smear No results found for: DIAGPAP Pap smear {done:10129} at today's visit.  -Breast Cancer screening indicated? {indicated:25516}  7. Chronic Disease/Pregnancy Condition follow up: {Follow up:25499}  - PCP follow up  Isabell Jarvis, RN Center for [employer], [facility]

## 2021-08-26 ENCOUNTER — Ambulatory Visit: Payer: Medicaid Other | Admitting: Family Medicine

## 2023-03-10 NOTE — Therapy (Deleted)
OUTPATIENT PHYSICAL THERAPY THORACOLUMBAR EVALUATION   Patient Name: Joan Tran MRN: [id number] DOB:07-06-1986, 37 y.o., female Today's Date: 03/10/2023  END OF SESSION:   Past Medical History:  Diagnosis Date   Anxiety    PONV (postoperative nausea and vomiting)    Past Surgical History:  Procedure Laterality Date   CESAREAN SECTION     CESAREAN SECTION N/A 04/15/2015   Procedure: CESAREAN SECTION;  Surgeon: Lavina Hamman, MD;  Location: [facility];  Service: Obstetrics;  Laterality: N/A;  Tracie S. RNFA confirmed 03/22/15    CESAREAN SECTION N/A 07/11/2019   Procedure: CESAREAN SECTION;  Surgeon: Levie Heritage, DO;  Location: [facility];  Service: Obstetrics;  Laterality: N/A;   Patient Active Problem List   Diagnosis Date Noted   Substance use 07/26/2021   No prenatal care in current pregnancy 07/26/2021   Previous cesarean delivery affecting pregnancy, antepartum 07/25/2021   VBAC (vaginal birth after Cesarean) 07/25/2021   At risk for abuse of opiates 07/13/2019   Status post cesarean section 07/11/2019   S/P cesarean section 04/15/2015   Pregnancy 08/29/2014    PCP: Patient, No Pcp PerPCP - General   REFERRING PROVIDER: Jackie Plum, MD   REFERRING DIAG: M54.41 (ICD-10-CM) - Lumbago with sciatica, right side  Rationale for Evaluation and Treatment: Rehabilitation  THERAPY DIAG:  No diagnosis found.  ONSET DATE: 4-5 weeks ago  SUBJECTIVE:                                                                                                                                                                                           SUBJECTIVE STATEMENT: ***  PERTINENT HISTORY:    PAIN:  Are you having pain? {OPRCPAIN:27236}  PRECAUTIONS: None  RED FLAGS: None   WEIGHT BEARING RESTRICTIONS: No  FALLS:  Has patient fallen in last 6 months? No  OCCUPATION: ***  PLOF: Independent  PATIENT GOALS: To reduce and manage my back symptoms  NEXT  MD VISIT: ***  OBJECTIVE:   DIAGNOSTIC FINDINGS:  none  PATIENT SURVEYS:  FOTO ***  SCREENING FOR RED FLAGS: Bowel or bladder incontinence: No  MUSCLE LENGTH: Hamstrings: Right *** deg; Left *** deg Thomas test: Right *** deg; Left *** deg  POSTURE: {posture:25561}  PALPATION: ***  LUMBAR ROM:   AROM eval  Flexion   Extension   Right lateral flexion   Left lateral flexion   Right rotation   Left rotation    (Blank rows = not tested)  LOWER EXTREMITY ROM:     {AROM/PROM:27142}  Right eval Left eval  Hip flexion    Hip extension    Hip  abduction    Hip adduction    Hip internal rotation    Hip external rotation    Knee flexion    Knee extension    Ankle dorsiflexion    Ankle plantarflexion    Ankle inversion    Ankle eversion     (Blank rows = not tested)  LOWER EXTREMITY MMT:    MMT Right eval Left eval  Hip flexion    Hip extension    Hip abduction    Hip adduction    Hip internal rotation    Hip external rotation    Knee flexion    Knee extension    Ankle dorsiflexion    Ankle plantarflexion    Ankle inversion    Ankle eversion     (Blank rows = not tested)  LUMBAR SPECIAL TESTS:  Straight leg raise test: {pos/neg:25243}, Slump test: {pos/neg:25243}, and FABER test: {pos/neg:25243}  FUNCTIONAL TESTS:  5 times sit to stand: ***  GAIT: Distance walked: 31ft x2 Assistive device utilized: None Level of assistance: Complete Independence Comments: ***  TODAY'S TREATMENT:                                                                                                                              DATE: ***    PATIENT EDUCATION:  Education details: Discussed eval findings, rehab rationale and POC and patient is in agreement  Person educated: Patient Education method: Explanation Education comprehension: verbalized understanding and needs further education  HOME EXERCISE PROGRAM: ***  ASSESSMENT:  CLINICAL IMPRESSION: Patient is  a 37 y.o. female who was seen today for physical therapy evaluation and treatment for ***.   OBJECTIVE IMPAIRMENTS: {opptimpairments:25111}.   ACTIVITY LIMITATIONS: {activitylimitations:27494}  PERSONAL FACTORS: {Personal factors:25162} are also affecting patient's functional outcome.   REHAB POTENTIAL: Good  CLINICAL DECISION MAKING: Stable/uncomplicated  EVALUATION COMPLEXITY: Low   GOALS: Goals reviewed with patient? No  SHORT TERM GOALS: Target date: ***  *** Baseline: Goal status: INITIAL  2.  *** Baseline:  Goal status: INITIAL  3.  *** Baseline:  Goal status: INITIAL  4.  *** Baseline:  Goal status: INITIAL  5.  *** Baseline:  Goal status: INITIAL  6.  *** Baseline:  Goal status: INITIAL  LONG TERM GOALS: Target date: ***  *** Baseline:  Goal status: INITIAL  2.  *** Baseline:  Goal status: INITIAL  3.  *** Baseline:  Goal status: INITIAL  4.  *** Baseline:  Goal status: INITIAL  5.  *** Baseline:  Goal status: INITIAL  6.  *** Baseline:  Goal status: INITIAL  PLAN:  PT FREQUENCY: 1-2x/week  PT DURATION: 6 weeks  PLANNED INTERVENTIONS: Therapeutic exercises, Therapeutic activity, Neuromuscular re-education, Balance training, Gait training, Patient/Family education, Self Care, Joint mobilization, Dry Needling, Electrical stimulation, Spinal mobilization, Cryotherapy, Moist heat, Manual therapy, and Re-evaluation.  PLAN FOR NEXT SESSION: HEP review and update, manual techniques as appropriate, aerobic tasks, ROM and flexibility activities, strengthening and  PREs, TPDN, gait and balance training as needed     Hildred Laser, PT 03/10/2023, 1:45 PM

## 2023-03-12 ENCOUNTER — Ambulatory Visit: Payer: Medicaid Other

## 2023-03-19 ENCOUNTER — Ambulatory Visit: Payer: 59 | Attending: Internal Medicine

## 2023-03-19 NOTE — Therapy (Deleted)
OUTPATIENT PHYSICAL THERAPY THORACOLUMBAR EVALUATION   Patient Name: Joan Tran MRN: [id number] DOB:[birth year], 37 y.o., female Today's Date: 03/19/2023  END OF SESSION:   Past Medical History:  Diagnosis Date   Anxiety    PONV (postoperative nausea and vomiting)    Past Surgical History:  Procedure Laterality Date   CESAREAN SECTION     CESAREAN SECTION N/A 04/15/2015   Procedure: CESAREAN SECTION;  Surgeon: Lavina Hamman, MD;  Location: [facility];  Service: Obstetrics;  Laterality: N/A;  Tracie S. RNFA confirmed 03/22/15    CESAREAN SECTION N/A 07/11/2019   Procedure: CESAREAN SECTION;  Surgeon: Levie Heritage, DO;  Location: [facility];  Service: Obstetrics;  Laterality: N/A;   Patient Active Problem List   Diagnosis Date Noted   Substance use 07/26/2021   No prenatal care in current pregnancy 07/26/2021   Previous cesarean delivery affecting pregnancy, antepartum 07/25/2021   VBAC (vaginal birth after Cesarean) 07/25/2021   At risk for abuse of opiates 07/13/2019   Status post cesarean section 07/11/2019   S/P cesarean section 04/15/2015   Pregnancy 08/29/2014    PCP: Patient, No Pcp Per   REFERRING PROVIDER: Jackie Plum, MD  REFERRING DIAG: M54.41 (ICD-10-CM) - Lumbago with sciatica, right side  Rationale for Evaluation and Treatment: Rehabilitation  THERAPY DIAG:  No diagnosis found.  ONSET DATE: ***  SUBJECTIVE:                                                                                                                                                                                           SUBJECTIVE STATEMENT: ***  PERTINENT HISTORY:    PAIN:  Are you having pain? {OPRCPAIN:27236}  PRECAUTIONS: None  RED FLAGS: None   WEIGHT BEARING RESTRICTIONS: No  FALLS:  Has patient fallen in last 6 months? No   OCCUPATION: ***  PLOF: Independent  PATIENT GOALS: To manage my back symptoms  NEXT MD VISIT: ***  OBJECTIVE:    DIAGNOSTIC FINDINGS:  none  PATIENT SURVEYS:  FOTO ***  MUSCLE LENGTH: Hamstrings: Right *** deg; Left *** deg Thomas test: Right *** deg; Left *** deg  POSTURE: {posture:25561}  PALPATION: ***  LUMBAR ROM:   AROM eval  Flexion   Extension   Right lateral flexion   Left lateral flexion   Right rotation   Left rotation    (Blank rows = not tested)  LOWER EXTREMITY ROM:     {AROM/PROM:27142}  Right eval Left eval  Hip flexion    Hip extension    Hip abduction    Hip adduction    Hip internal rotation    Hip  external rotation    Knee flexion    Knee extension    Ankle dorsiflexion    Ankle plantarflexion    Ankle inversion    Ankle eversion     (Blank rows = not tested)  LOWER EXTREMITY MMT:    MMT Right eval Left eval  Hip flexion    Hip extension    Hip abduction    Hip adduction    Hip internal rotation    Hip external rotation    Knee flexion    Knee extension    Ankle dorsiflexion    Ankle plantarflexion    Ankle inversion    Ankle eversion     (Blank rows = not tested)  LUMBAR SPECIAL TESTS:  Straight leg raise test: {pos/neg:25243} and Slump test: {pos/neg:25243}  FUNCTIONAL TESTS:  5 times sit to stand: ***  GAIT: Distance walked: 69ftx2 Assistive device utilized: None Level of assistance: Complete Independence Comments: ***  TODAY'S TREATMENT:                                                                                                                              DATE: 03/19/23    PATIENT EDUCATION:  Education details: Discussed eval findings, rehab rationale and POC and patient is in agreement  Person educated: Patient Education method: Explanation Education comprehension: verbalized understanding and needs further education  HOME EXERCISE PROGRAM: ***  ASSESSMENT:  CLINICAL IMPRESSION: Patient is a *** y.o. *** who was seen today for physical therapy evaluation and treatment for ***.   OBJECTIVE IMPAIRMENTS:  {opptimpairments:25111}.   ACTIVITY LIMITATIONS: {activitylimitations:27494}  PARTICIPATION LIMITATIONS: {participationrestrictions:25113}  REHAB POTENTIAL: Good  CLINICAL DECISION MAKING: Stable/uncomplicated  EVALUATION COMPLEXITY: Low   GOALS: Goals reviewed with patient? No  SHORT TERM GOALS: Target date: ***  *** Baseline: Goal status: INITIAL  2.  *** Baseline:  Goal status: INITIAL  3.  *** Baseline:  Goal status: INITIAL  4.  *** Baseline:  Goal status: INITIAL  5.  *** Baseline:  Goal status: INITIAL  6.  *** Baseline:  Goal status: INITIAL  LONG TERM GOALS: Target date: ***  *** Baseline:  Goal status: INITIAL  2.  *** Baseline:  Goal status: INITIAL  3.  *** Baseline:  Goal status: INITIAL  4.  *** Baseline:  Goal status: INITIAL  5.  *** Baseline:  Goal status: INITIAL  6.  *** Baseline:  Goal status: INITIAL  PLAN:  PT FREQUENCY: 1-2x/week  PT DURATION: 4 weeks  PLANNED INTERVENTIONS: Therapeutic exercises, Therapeutic activity, Neuromuscular re-education, Balance training, Gait training, Patient/Family education, Self Care, Joint mobilization, Dry Needling, Electrical stimulation, Spinal mobilization, Cryotherapy, Moist heat, Manual therapy, and Re-evaluation.  PLAN FOR NEXT SESSION: ***   Hildred Laser, PT 03/19/2023, 6:48 AM  OUTPATIENT PHYSICAL THERAPY THORACOLUMBAR EVALUATION   Patient Name: Joan Tran MRN: [id number] DOB:Aug 11, 1986, 37 y.o., female Today's Date: 03/19/2023  END OF SESSION:   Past Medical History:  Diagnosis Date  Anxiety    PONV (postoperative nausea and vomiting)    Past Surgical History:  Procedure Laterality Date   CESAREAN SECTION     CESAREAN SECTION N/A 04/15/2015   Procedure: CESAREAN SECTION;  Surgeon: Lavina Hamman, MD;  Location: [facility];  Service: Obstetrics;  Laterality: N/A;  Tracie S. RNFA confirmed 03/22/15    CESAREAN SECTION N/A 07/11/2019   Procedure:  CESAREAN SECTION;  Surgeon: Levie Heritage, DO;  Location: [facility];  Service: Obstetrics;  Laterality: N/A;   Patient Active Problem List   Diagnosis Date Noted   Substance use 07/26/2021   No prenatal care in current pregnancy 07/26/2021   Previous cesarean delivery affecting pregnancy, antepartum 07/25/2021   VBAC (vaginal birth after Cesarean) 07/25/2021   At risk for abuse of opiates 07/13/2019   Status post cesarean section 07/11/2019   S/P cesarean section 04/15/2015   Pregnancy 08/29/2014    PCP: Patient, No Pcp PerPCP - General   REFERRING PROVIDER: Jackie Plum, MD   REFERRING DIAG: M54.41 (ICD-10-CM) - Lumbago with sciatica, right side  Rationale for Evaluation and Treatment: Rehabilitation  THERAPY DIAG:  No diagnosis found.  ONSET DATE: 4-5 weeks ago  SUBJECTIVE:                                                                                                                                                                                           SUBJECTIVE STATEMENT: ***  PERTINENT HISTORY:    PAIN:  Are you having pain? {OPRCPAIN:27236}  PRECAUTIONS: None  RED FLAGS: None   WEIGHT BEARING RESTRICTIONS: No  FALLS:  Has patient fallen in last 6 months? No  OCCUPATION: ***  PLOF: Independent  PATIENT GOALS: To reduce and manage my back symptoms  NEXT MD VISIT: ***  OBJECTIVE:   DIAGNOSTIC FINDINGS:  none  PATIENT SURVEYS:  FOTO ***  SCREENING FOR RED FLAGS: Bowel or bladder incontinence: No  MUSCLE LENGTH: Hamstrings: Right *** deg; Left *** deg Thomas test: Right *** deg; Left *** deg  POSTURE: {posture:25561}  PALPATION: ***  LUMBAR ROM:   AROM eval  Flexion   Extension   Right lateral flexion   Left lateral flexion   Right rotation   Left rotation    (Blank rows = not tested)  LOWER EXTREMITY ROM:     {AROM/PROM:27142}  Right eval Left eval  Hip flexion    Hip extension    Hip abduction    Hip  adduction    Hip internal rotation    Hip external rotation    Knee flexion    Knee extension    Ankle dorsiflexion  Ankle plantarflexion    Ankle inversion    Ankle eversion     (Blank rows = not tested)  LOWER EXTREMITY MMT:    MMT Right eval Left eval  Hip flexion    Hip extension    Hip abduction    Hip adduction    Hip internal rotation    Hip external rotation    Knee flexion    Knee extension    Ankle dorsiflexion    Ankle plantarflexion    Ankle inversion    Ankle eversion     (Blank rows = not tested)  LUMBAR SPECIAL TESTS:  Straight leg raise test: {pos/neg:25243}, Slump test: {pos/neg:25243}, and FABER test: {pos/neg:25243}  FUNCTIONAL TESTS:  5 times sit to stand: ***  GAIT: Distance walked: 70ft x2 Assistive device utilized: None Level of assistance: Complete Independence Comments: ***  TODAY'S TREATMENT:                                                                                                                              DATE: ***    PATIENT EDUCATION:  Education details: Discussed eval findings, rehab rationale and POC and patient is in agreement  Person educated: Patient Education method: Explanation Education comprehension: verbalized understanding and needs further education  HOME EXERCISE PROGRAM: ***  ASSESSMENT:  CLINICAL IMPRESSION: Patient is a 37 y.o. female who was seen today for physical therapy evaluation and treatment for ***.   OBJECTIVE IMPAIRMENTS: {opptimpairments:25111}.   ACTIVITY LIMITATIONS: {activitylimitations:27494}  PERSONAL FACTORS: {Personal factors:25162} are also affecting patient's functional outcome.   REHAB POTENTIAL: Good  CLINICAL DECISION MAKING: Stable/uncomplicated  EVALUATION COMPLEXITY: Low   GOALS: Goals reviewed with patient? No  SHORT TERM GOALS: Target date: ***  *** Baseline: Goal status: INITIAL  2.  *** Baseline:  Goal status: INITIAL  3.  *** Baseline:  Goal  status: INITIAL  4.  *** Baseline:  Goal status: INITIAL  5.  *** Baseline:  Goal status: INITIAL  6.  *** Baseline:  Goal status: INITIAL  LONG TERM GOALS: Target date: ***  *** Baseline:  Goal status: INITIAL  2.  *** Baseline:  Goal status: INITIAL  3.  *** Baseline:  Goal status: INITIAL  4.  *** Baseline:  Goal status: INITIAL  5.  *** Baseline:  Goal status: INITIAL  6.  *** Baseline:  Goal status: INITIAL  PLAN:  PT FREQUENCY: 1-2x/week  PT DURATION: 6 weeks  PLANNED INTERVENTIONS: Therapeutic exercises, Therapeutic activity, Neuromuscular re-education, Balance training, Gait training, Patient/Family education, Self Care, Joint mobilization, Dry Needling, Electrical stimulation, Spinal mobilization, Cryotherapy, Moist heat, Manual therapy, and Re-evaluation.  PLAN FOR NEXT SESSION: HEP review and update, manual techniques as appropriate, aerobic tasks, ROM and flexibility activities, strengthening and PREs, TPDN, gait and balance training as needed     Hildred Laser, PT 03/19/2023, 6:48 AM
